# Patient Record
Sex: Male | Born: 1950 | Race: White | Hispanic: No | Marital: Single | Smoking: Former smoker
Health system: Southern US, Community
[De-identification: ages and names within clinical notes are randomized; demographics above are authoritative.]

## PROBLEM LIST (undated history)

## (undated) DIAGNOSIS — I429 Cardiomyopathy, unspecified: Secondary | ICD-10-CM

## (undated) DIAGNOSIS — J449 Chronic obstructive pulmonary disease, unspecified: Secondary | ICD-10-CM

## (undated) DIAGNOSIS — I499 Cardiac arrhythmia, unspecified: Secondary | ICD-10-CM

## (undated) DIAGNOSIS — I10 Essential (primary) hypertension: Secondary | ICD-10-CM

## (undated) DIAGNOSIS — I509 Heart failure, unspecified: Secondary | ICD-10-CM

## (undated) HISTORY — PX: HERNIA REPAIR: SHX51

## (undated) HISTORY — DX: Heart failure, unspecified: I50.9

## (undated) HISTORY — DX: Essential (primary) hypertension: I10

## (undated) HISTORY — DX: Cardiac arrhythmia, unspecified: I49.9

## (undated) HISTORY — DX: Cardiomyopathy, unspecified: I42.9

## (undated) HISTORY — DX: Chronic obstructive pulmonary disease, unspecified: J44.9

---

## 2005-12-15 ENCOUNTER — Emergency Department (HOSPITAL_COMMUNITY): Admission: EM | Admit: 2005-12-15 | Discharge: 2005-12-15 | Payer: Self-pay | Admitting: Emergency Medicine

## 2005-12-22 ENCOUNTER — Ambulatory Visit: Payer: Self-pay | Admitting: Internal Medicine

## 2005-12-23 ENCOUNTER — Ambulatory Visit: Payer: Self-pay | Admitting: *Deleted

## 2010-03-28 ENCOUNTER — Ambulatory Visit: Payer: Self-pay | Admitting: Adult Health

## 2011-05-23 ENCOUNTER — Ambulatory Visit: Payer: Self-pay | Admitting: "Endocrinology

## 2013-01-04 ENCOUNTER — Ambulatory Visit: Payer: Self-pay | Admitting: Internal Medicine

## 2013-02-02 DIAGNOSIS — J449 Chronic obstructive pulmonary disease, unspecified: Secondary | ICD-10-CM | POA: Insufficient documentation

## 2014-01-25 LAB — PSA: PSA: 0.7

## 2014-02-13 ENCOUNTER — Ambulatory Visit: Payer: Self-pay | Admitting: Internal Medicine

## 2014-09-06 ENCOUNTER — Ambulatory Visit: Payer: Self-pay | Admitting: Internal Medicine

## 2014-09-06 DIAGNOSIS — I429 Cardiomyopathy, unspecified: Secondary | ICD-10-CM | POA: Insufficient documentation

## 2014-09-06 DIAGNOSIS — I10 Essential (primary) hypertension: Secondary | ICD-10-CM | POA: Insufficient documentation

## 2014-09-06 LAB — HEPATIC FUNCTION PANEL
ALT: 18 U/L (ref 10–40)
AST: 14 U/L (ref 14–40)
Alkaline Phosphatase: 90 U/L (ref 25–125)
Bilirubin, Total: 0.7 mg/dL

## 2014-09-06 LAB — BASIC METABOLIC PANEL
BUN: 6 mg/dL (ref 4–21)
CREATININE: 0.9 mg/dL (ref 0.6–1.3)
Glucose: 409 mg/dL
POTASSIUM: 4.6 mmol/L (ref 3.4–5.3)
SODIUM: 137 mmol/L (ref 137–147)

## 2014-09-06 LAB — CBC AND DIFFERENTIAL
HCT: 43 % (ref 41–53)
HEMOGLOBIN: 14.9 g/dL (ref 13.5–17.5)
Neutrophils Absolute: 5 /uL
Platelets: 171 10*3/uL (ref 150–399)
WBC: 7.5 10^3/mL

## 2014-09-06 LAB — TSH: TSH: 2.66 u[IU]/mL (ref 0.41–5.90)

## 2014-09-06 LAB — LIPID PANEL
CHOLESTEROL: 165 mg/dL (ref 0–200)
HDL: 29 mg/dL — AB (ref 35–70)
LDL Cholesterol: 100 mg/dL
TRIGLYCERIDES: 179 mg/dL — AB (ref 40–160)

## 2014-10-04 ENCOUNTER — Other Ambulatory Visit: Payer: Self-pay

## 2014-10-04 LAB — HEMOGLOBIN A1C: Hemoglobin A1C: 13.2

## 2014-10-17 ENCOUNTER — Ambulatory Visit: Payer: Self-pay

## 2014-10-17 DIAGNOSIS — E119 Type 2 diabetes mellitus without complications: Secondary | ICD-10-CM | POA: Insufficient documentation

## 2014-11-21 ENCOUNTER — Ambulatory Visit: Payer: Self-pay

## 2014-12-18 ENCOUNTER — Encounter: Payer: Self-pay | Admitting: Pharmacist

## 2014-12-18 ENCOUNTER — Encounter (INDEPENDENT_AMBULATORY_CARE_PROVIDER_SITE_OTHER): Payer: Self-pay

## 2015-04-11 ENCOUNTER — Ambulatory Visit: Payer: Self-pay | Admitting: Internal Medicine

## 2015-07-23 DIAGNOSIS — I10 Essential (primary) hypertension: Secondary | ICD-10-CM

## 2015-07-23 DIAGNOSIS — E119 Type 2 diabetes mellitus without complications: Secondary | ICD-10-CM

## 2015-07-23 DIAGNOSIS — J449 Chronic obstructive pulmonary disease, unspecified: Secondary | ICD-10-CM

## 2015-07-23 DIAGNOSIS — I429 Cardiomyopathy, unspecified: Secondary | ICD-10-CM

## 2016-05-08 HISTORY — PX: WRIST FRACTURE SURGERY: SHX121

## 2016-07-10 ENCOUNTER — Emergency Department
Admission: EM | Admit: 2016-07-10 | Discharge: 2016-07-10 | Disposition: A | Discharge status: Home / Self Care | Payer: Medicare Other | Attending: Emergency Medicine | Admitting: Emergency Medicine

## 2016-07-10 ENCOUNTER — Encounter: Payer: Self-pay | Admitting: *Deleted

## 2016-07-10 ENCOUNTER — Emergency Department: Payer: Medicare Other

## 2016-07-10 DIAGNOSIS — L03119 Cellulitis of unspecified part of limb: Secondary | ICD-10-CM

## 2016-07-10 DIAGNOSIS — L03113 Cellulitis of right upper limb: Secondary | ICD-10-CM | POA: Insufficient documentation

## 2016-07-10 DIAGNOSIS — J449 Chronic obstructive pulmonary disease, unspecified: Secondary | ICD-10-CM | POA: Diagnosis not present

## 2016-07-10 DIAGNOSIS — L02511 Cutaneous abscess of right hand: Secondary | ICD-10-CM | POA: Insufficient documentation

## 2016-07-10 DIAGNOSIS — M79641 Pain in right hand: Secondary | ICD-10-CM | POA: Diagnosis present

## 2016-07-10 DIAGNOSIS — I10 Essential (primary) hypertension: Secondary | ICD-10-CM | POA: Diagnosis not present

## 2016-07-10 DIAGNOSIS — Z87891 Personal history of nicotine dependence: Secondary | ICD-10-CM | POA: Diagnosis not present

## 2016-07-10 DIAGNOSIS — L02519 Cutaneous abscess of unspecified hand: Secondary | ICD-10-CM

## 2016-07-10 LAB — CBC WITH DIFFERENTIAL/PLATELET
BASOS ABS: 0 10*3/uL (ref 0–0.1)
BASOS PCT: 1 %
EOS ABS: 0.1 10*3/uL (ref 0–0.7)
Eosinophils Relative: 1 %
HCT: 41.9 % (ref 40.0–52.0)
HEMOGLOBIN: 14 g/dL (ref 13.0–18.0)
Lymphocytes Relative: 32 %
Lymphs Abs: 2.2 10*3/uL (ref 1.0–3.6)
MCH: 29.3 pg (ref 26.0–34.0)
MCHC: 33.5 g/dL (ref 32.0–36.0)
MCV: 87.6 fL (ref 80.0–100.0)
Monocytes Absolute: 0.6 10*3/uL (ref 0.2–1.0)
Monocytes Relative: 8 %
NEUTROS PCT: 58 %
Neutro Abs: 4.1 10*3/uL (ref 1.4–6.5)
PLATELETS: 166 10*3/uL (ref 150–440)
RBC: 4.79 MIL/uL (ref 4.40–5.90)
RDW: 15.9 % — AB (ref 11.5–14.5)
WBC: 7 10*3/uL (ref 3.8–10.6)

## 2016-07-10 LAB — BASIC METABOLIC PANEL
Anion gap: 7 (ref 5–15)
BUN: 14 mg/dL (ref 6–20)
CALCIUM: 9.4 mg/dL (ref 8.9–10.3)
CO2: 26 mmol/L (ref 22–32)
CREATININE: 0.71 mg/dL (ref 0.61–1.24)
Chloride: 107 mmol/L (ref 101–111)
GFR calc non Af Amer: >60 mL/min (ref >60)
Glucose, Bld: 103 mg/dL — ABNORMAL HIGH (ref 65–99)
Potassium: 4.2 mmol/L (ref 3.5–5.1)
Sodium: 140 mmol/L (ref 135–145)

## 2016-07-10 MED ORDER — SILVER SULFADIAZINE 1 % EX CREA: TOPICAL_CREAM | CUTANEOUS | 1 refills | Status: DC

## 2016-07-10 MED ORDER — CLINDAMYCIN HCL 300 MG PO CAPS: 300.0000 mg | ORAL_CAPSULE | Freq: Three times a day (TID) | ORAL | 0 refills | Status: DC

## 2016-07-10 MED ORDER — VANCOMYCIN HCL IN DEXTROSE 1-5 GM/200ML-% IV SOLN
1000.0000 mg | Freq: Once | INTRAVENOUS | Status: AC
  Administered 2016-07-10: 1000 mg via INTRAVENOUS
  Filled 2016-07-10: qty 200

## 2016-07-10 NOTE — ED Provider Notes (Signed)
[facility] Emergency Department Provider Note       Time seen: ----------------------------------------- 6:24 PM on 07/10/2016 -----------------------------------------     I have reviewed the triage vital signs and the nursing notes.   HISTORY   Chief Complaint Hand Pain    HPI Leon Montoya is a 66 y.o. male who presents to the ED for right hand swelling. Patient states that February he slipped and fell down a wet bank and injury to his right hand. He subsequently underwent extensive wound care and debridement at the [facility]. Patient was seen at [facility] clinic today for dressing change and was told to come here for swelling. Patient states it's more swollen than it has been but is not causing any pain. He denies fevers, chills or other complaints.   Past Medical History:  Diagnosis Date  . Cardiomyopathy (HCC)   . COPD (chronic obstructive pulmonary disease) (HCC)   . Hypertension     Patient Active Problem List   Diagnosis Date Noted  . Diabetes (HCC) 10/17/2014  . Hypertension 09/06/2014  . Cardiomyopathy (HCC) 09/06/2014  . COPD (chronic obstructive pulmonary disease) (HCC) 02/02/2013    Past Surgical History:  Procedure Laterality Date  . HERNIA REPAIR     x 3    Allergies Penicillins  Social History Social History  Substance Use Topics  . Smoking status: Former Smoker    Types: Cigarettes    Quit date: 10/05/2004  . Smokeless tobacco: Not on file  . Alcohol use No    Review of Systems Constitutional: Negative for fever. Cardiovascular: Negative for chest pain. Respiratory: Negative for shortness of breath. Gastrointestinal: Negative for abdominal pain, vomiting and diarrhea. Musculoskeletal: Positive for right hand swelling Skin: Positive for right hand wounds Neurological: Negative for headaches, focal weakness or numbness.  10-point ROS otherwise  negative.  ____________________________________________   PHYSICAL EXAM:  VITAL SIGNS: ED Triage Vitals  Enc Vitals Group     BP 07/10/16 1632 (!) 149/83     Pulse Rate 07/10/16 1632 74     Resp 07/10/16 1632 18     Temp 07/10/16 1632 98.3 F (36.8 C)     Temp Source 07/10/16 1632 Oral     SpO2 07/10/16 1632 99 %     Weight 07/10/16 1633 196 lb (88.9 kg)     Height 07/10/16 1633  (1.778 m)     Head Circumference --      Peak Flow --      Pain Score --      Pain Loc --      Pain Edu? --      Excl. in GC? --     Constitutional: Alert and oriented. Well appearing and in no distress. Eyes: Conjunctivae are normal. PERRL. Normal extraocular movements. Cardiovascular: Normal rate, regular rhythm. No murmurs, rubs, or gallops. Respiratory: Normal respiratory effort without tachypnea nor retractions. Breath sounds are clear and equal bilaterally. No wheezes/rales/rhonchi. Gastrointestinal: Soft and nontender. Normal bowel sounds Musculoskeletal: Marked right hand swelling that is diffuse but nontender. He has limited range of motion of his fingers. There are multiple wounds on the dorsal and ventral aspect of his right hand that appeared to be subacute or chronic. There is no active cellulitis visible or purulent drainage. Neurologic:  Normal speech and language. No gross focal neurologic deficits are appreciated.  Skin:  Right hand wounds and swelling as noted above Psychiatric: Mood and affect are normal. Speech and behavior are normal.  ____________________________________________  ED COURSE:  Pertinent labs & imaging results that were available during my care of the patient were reviewed by me and considered in my medical decision making (see chart for details). Patient presents for right hand swelling, we will assess with labs and imaging as indicated.   Procedures ____________________________________________   LABS (pertinent positives/negatives)  Labs Reviewed   CBC WITH DIFFERENTIAL/PLATELET - Abnormal; Notable for the following:       Result Value   RDW 15.9 (*)    All other components within normal limits  BASIC METABOLIC PANEL - Abnormal; Notable for the following:    Glucose, Bld 103 (*)    All other components within normal limits    RADIOLOGY Images were viewed by me  Right hand x-rays IMPRESSION: Comminuted distal radius fracture with associated ulnar styloid fracture.  Distally amputation second and third fingers.  Diffuse osteopenia.  Diffuse soft tissue edema. Soft tissue infection cannot be excluded by x-ray. ____________________________________________  FINAL ASSESSMENT AND PLAN  Right hand swelling, history of complex distal forearm fracture and wounds, cellulitis  Plan: Patient's labs and imaging were dictated above. Patient had presented for right hand swelling. Patient was not concerned about any pain and states it somewhat more swollen than normal. He was given IV vancomycin and will be discharged with antibiotics and close outpatient wound follow-up. I will refer him to the wound healing Center.   Emily Filbert, MD   Note: This note was generated in part or whole with voice recognition software. Voice recognition is usually quite accurate but there are transcription errors that can and very often do occur. I apologize for any typographical errors that were not detected and corrected.     Emily Filbert, MD 07/10/16 1901

## 2016-07-10 NOTE — ED Triage Notes (Signed)
States in february he slipped and fell down a wet bank and had an injury to his right hand, states in February sometime he had his right hand debrided [facility], states he went to [facility] clinic today for a dressing change on his right hand and was sent to ED for right hand swelling, arrives with right hand wrapped in gauze, hand appears more swollen then left, able to move fingers, color appropriate

## 2016-07-14 ENCOUNTER — Encounter: Payer: Medicare Other | Attending: Surgery | Admitting: Surgery

## 2016-07-14 DIAGNOSIS — E11622 Type 2 diabetes mellitus with other skin ulcer: Secondary | ICD-10-CM | POA: Insufficient documentation

## 2016-07-14 DIAGNOSIS — E1140 Type 2 diabetes mellitus with diabetic neuropathy, unspecified: Secondary | ICD-10-CM | POA: Diagnosis not present

## 2016-07-14 DIAGNOSIS — Z87891 Personal history of nicotine dependence: Secondary | ICD-10-CM | POA: Diagnosis not present

## 2016-07-14 DIAGNOSIS — I10 Essential (primary) hypertension: Secondary | ICD-10-CM | POA: Diagnosis not present

## 2016-07-14 DIAGNOSIS — J449 Chronic obstructive pulmonary disease, unspecified: Secondary | ICD-10-CM | POA: Insufficient documentation

## 2016-07-14 DIAGNOSIS — Z79899 Other long term (current) drug therapy: Secondary | ICD-10-CM | POA: Diagnosis not present

## 2016-07-14 DIAGNOSIS — I429 Cardiomyopathy, unspecified: Secondary | ICD-10-CM | POA: Diagnosis not present

## 2016-07-14 DIAGNOSIS — S61401A Unspecified open wound of right hand, initial encounter: Secondary | ICD-10-CM | POA: Insufficient documentation

## 2016-07-14 DIAGNOSIS — Z880 Allergy status to penicillin: Secondary | ICD-10-CM | POA: Diagnosis not present

## 2016-07-14 DIAGNOSIS — T8781 Dehiscence of amputation stump: Secondary | ICD-10-CM | POA: Insufficient documentation

## 2016-07-14 DIAGNOSIS — Y839 Surgical procedure, unspecified as the cause of abnormal reaction of the patient, or of later complication, without mention of misadventure at the time of the procedure: Secondary | ICD-10-CM | POA: Insufficient documentation

## 2016-07-14 NOTE — Progress Notes (Signed)
Danny, [name] ([id number]) Visit Report for 07/14/2016 Abuse/Suicide Risk Screen Details Patient Name: Danny [name], Danny [name]. Date of Service: 07/14/2016 8:00 AM Medical Record Number: [id number] Patient Account Number: [id number] Date of Birth/Sex: [birth year] (66 y.o. Male) Treating RN: Curtis Sites Primary Care Yasha Tibbett: Lanier Ensign Other Clinician: Referring Curtiss Mahmood: Daryel November Treating Andrez Lieurance/Extender: Rudene Re in Treatment: 0 Abuse/Suicide Risk Screen Items Answer ABUSE/SUICIDE RISK SCREEN: Has anyone close to you tried to hurt or harm you recentlyo No Do you feel uncomfortable with anyone in your familyo No Has anyone forced you do things that you didnot want to doo No Do you have any thoughts of harming yourselfo No Patient displays signs or symptoms of abuse and/or neglect. No Electronic Signature(s) Signed: 07/14/2016 9:41:00 AM By: Curtis Sites Entered By: Curtis Sites on 07/14/2016 08:20:57 Fissel, Danny [name] ([id number]) -------------------------------------------------------------------------------- Activities of Daily Living Details Patient Name: [name], [name] Date of Service: 07/14/2016 8:00 AM Medical Record Number: [id number] Patient Account Number: [id number] Date of Birth/Sex: [birth year] (66 y.o. Male) Treating RN: Curtis Sites Primary Care Allice Garro: Lanier Ensign Other Clinician: Referring Luise Yamamoto: Daryel November Treating Rahma Meller/Extender: Rudene Re in Treatment: 0 Activities of Daily Living Items Answer Activities of Daily Living (Please select one for each item) Drive Automobile Not Able Take Medications Completely Able Use Telephone Completely Able Care for Appearance Completely Able Use Toilet Completely Able Bath / Shower Completely Able Dress Self Completely Able Feed Self Completely Able Walk Completely Able Get In / Out Bed Completely Able Housework Completely Able Prepare Meals Completely  Able Handle Money Completely Able Shop for Self Completely Able Electronic Signature(s) Signed: 07/14/2016 9:41:00 AM By: Curtis Sites Entered By: Curtis Sites on 07/14/2016 08:21:28 Vogel, Danny [name] ([id number]) -------------------------------------------------------------------------------- Education Assessment Details Patient Name: [name], [name] Date of Service: 07/14/2016 8:00 AM Medical Record Number: [id number] Patient Account Number: [id number] Date of Birth/Sex: 1951-01-11 (66 y.o. Male) Treating RN: Curtis Sites Primary Care Octavie Westerhold: Lanier Ensign Other Clinician: Referring Aronda Burford: Daryel November Treating Aluel Schwarz/Extender: Rudene Re in Treatment: 0 Primary Learner Assessed: Caregiver HHRN Reason Patient is not Primary Learner: wound location Learning Preferences/Education Level/Primary Language Learning Preference: Explanation, Demonstration Highest Education Level: High School Preferred Language: English Cognitive Barrier Assessment/Beliefs Language Barrier: No Translator Needed: No Memory Deficit: No Emotional Barrier: No Cultural/Religious Beliefs Affecting Medical No Care: Physical Barrier Assessment Impaired Vision: No Impaired Hearing: No Decreased Hand dexterity: No Knowledge/Comprehension Assessment Knowledge Level: Medium Comprehension Level: Medium Ability to understand written Medium instructions: Ability to understand verbal Medium instructions: Motivation Assessment Anxiety Level: Calm Cooperation: Cooperative Education Importance: Acknowledges Need Interest in Health Problems: Asks Questions Perception: Coherent Willingness to Engage in Self- Medium Management Activities: Readiness to Engage in Self- Medium Management Activities: VERA, Danny [name] ([id number]) Electronic Signature(s) Signed: 07/14/2016 9:41:00 AM By: Curtis Sites Entered By: Curtis Sites on 07/14/2016 08:22:06 Piggott, Danny [name]  ([id number]) -------------------------------------------------------------------------------- Fall Risk Assessment Details Patient Name: [name], [name] Date of Service: 07/14/2016 8:00 AM Medical Record Number: [id number] Patient Account Number: [id number] Date of Birth/Sex: July 05, 1950 (66 y.o. Male) Treating RN: Curtis Sites Primary Care Tephanie Escorcia: Lanier Ensign Other Clinician: Referring Iya Hamed: Daryel November Treating Glyndon Tursi/Extender: Rudene Re in Treatment: 0 Fall Risk Assessment Items Have you had 2 or more falls in the last 12 monthso 0 No Have you had any fall that resulted in injury in the last 12 monthso 0 Yes FALL RISK ASSESSMENT: History of falling - immediate or within 3 months 25 Yes Secondary diagnosis 0 No Ambulatory aid  None/bed rest/wheelchair/nurse 0 No Crutches/cane/walker 0 No Furniture 0 No IV Access/Saline Lock 0 No Gait/Training Normal/bed rest/immobile 0 No Weak 10 Yes Impaired 0 No Mental Status Oriented to own ability 0 Yes Electronic Signature(s) Signed: 07/14/2016 9:41:00 AM By: Curtis Sites Entered By: Curtis Sites on 07/14/2016 08:22:29 Capitano, Danny [name] ([id number]) -------------------------------------------------------------------------------- Nutrition Risk Assessment Details Patient Name: [name], [name] Date of Service: 07/14/2016 8:00 AM Medical Record Number: [id number] Patient Account Number: [id number] Date of Birth/Sex: 12/23/1950 (66 y.o. Male) Treating RN: Curtis Sites Primary Care Shiah Berhow: Lanier Ensign Other Clinician: Referring Zellie Jenning: Daryel November Treating Keefe Zawistowski/Extender: Rudene Re in Treatment: 0 Height (in): 71 Weight (lbs): 183 Body Mass Index (BMI): 25.5 Nutrition Risk Assessment Items NUTRITION RISK SCREEN: I have an illness or condition that made me change the kind and/or 0 No amount of food I eat I eat fewer than two meals per day 0 No I eat few fruits and  vegetables, or milk products 0 No I have three or more drinks of beer, liquor or wine almost every day 0 No I have tooth or mouth problems that make it hard for me to eat 0 No I don't always have enough money to buy the food I need 0 No I eat alone most of the time 0 No I take three or more different prescribed or over-the-counter drugs a 1 Yes day Without wanting to, I have lost or gained 10 pounds in the last six 0 No months I am not always physically able to shop, cook and/or feed myself 0 No Nutrition Protocols Good Risk Protocol 0 No interventions needed Moderate Risk Protocol Electronic Signature(s) Signed: 07/14/2016 9:41:00 AM By: Curtis Sites Entered By: Curtis Sites on 07/14/2016 08:22:34

## 2016-07-14 NOTE — Progress Notes (Addendum)
Danny, [name] ([id number]) Visit Report for 07/14/2016 Allergy List Details Patient Name: Danny [name], Danny [name]. Date of Service: 07/14/2016 8:00 AM Medical Record Number: [id number] Patient Account Number: [id number] Date of Birth/Sex: 14-Nov-1950 (66 y.o. Male) Treating RN: Curtis Sites Primary Care Lemuel Boodram: Lanier Ensign Other Clinician: Referring Wataru Mccowen: Daryel November Treating Eino Whitner/Extender: Rudene Re in Treatment: 0 Allergies Active Allergies penicillin Allergy Notes Electronic Signature(s) Signed: 07/14/2016 9:41:00 AM By: Curtis Sites Entered By: Curtis Sites on 07/14/2016 08:20:49 Aspinall, Ivan Anchors ([id number]) -------------------------------------------------------------------------------- Arrival Information Details Patient Name: Danny, Zylen L. Date of Service: 07/14/2016 8:00 AM Medical Record Number: [id number] Patient Account Number: [id number] Date of Birth/Sex: 05-02-1950 (66 y.o. Male) Treating RN: Curtis Sites Primary Care Milyn Stapleton: Lanier Ensign Other Clinician: Referring Tita Terhaar: Daryel November Treating Tamon Parkerson/Extender: Rudene Re in Treatment: 0 Visit Information Patient Arrived: Ambulatory Arrival Time: 08:16 Accompanied By: girlfriend Transfer Assistance: None Patient Identification Verified: Yes Secondary Verification Process Yes Completed: Patient Has Alerts: Yes Patient Alerts: DMII Electronic Signature(s) Signed: 07/14/2016 9:41:00 AM By: Curtis Sites Entered By: Curtis Sites on 07/14/2016 08:17:32 Junio, Ivan Anchors ([id number]) -------------------------------------------------------------------------------- Encounter Discharge Information Details Patient Name: Danny Field, Olajuwon L. Date of Service: 07/14/2016 8:00 AM Medical Record Number: [id number] Patient Account Number: [id number] Date of Birth/Sex: [birth year] (66 y.o. Male) Treating RN: Curtis Sites Primary Care Ruthvik Barnaby: Lanier Ensign Other  Clinician: Referring Julie Paolini: Daryel November Treating Christyann Manolis/Extender: Rudene Re in Treatment: 0 Encounter Discharge Information Items Discharge Pain Level: 0 Discharge Condition: Stable Ambulatory Status: Ambulatory Discharge Destination: Home Transportation: Private Auto Accompanied By: girlfriend Schedule Follow-up Appointment: Yes Medication Reconciliation completed and provided to Patient/Care No Tarez Bowns: Provided on Clinical Summary of Care: 07/14/2016 Form Type Recipient Paper Patient RW Electronic Signature(s) Signed: 07/14/2016 9:23:00 AM By: Gwenlyn Perking Entered By: Gwenlyn Perking on 07/14/2016 09:23:00 Brett, Giavonni L. ([id number]) -------------------------------------------------------------------------------- Multi Wound Chart Details Patient Name: Danny, Jaece L. Date of Service: 07/14/2016 8:00 AM Medical Record Number: [id number] Patient Account Number: [id number] Date of Birth/Sex: [birth year] (66 y.o. Male) Treating RN: Curtis Sites Primary Care Sedalia Greeson: Lanier Ensign Other Clinician: Referring Mcdonald Reiling: Daryel November Treating Vondra Aldredge/Extender: Rudene Re in Treatment: 0 Vital Signs Height(in): 71 Pulse(bpm): 78 Weight(lbs): 183 Blood Pressure 137/91 (mmHg): Body Mass Index(BMI): 26 Temperature(F): 98.2 Respiratory Rate 18 (breaths/min): Photos: [1:No Photos] [2:No Photos] [3:No Photos] Wound Location: [1:Right Hand - Palm - Lateral] [2:Right Hand - Palm - Lateral] [3:Right Hand - 2nd Digit] Wounding Event: [1:Trauma] [2:Trauma] [3:Trauma] Primary Etiology: [1:Trauma, Other] [2:Trauma, Other] [3:Open Surgical Wound] Comorbid History: [1:Chronic Obstructive Pulmonary Disease (COPD), Hypertension, Type II Diabetes, Neuropathy] [2:Chronic Obstructive Pulmonary Disease (COPD), Hypertension, Type II Diabetes, Neuropathy] [3:Chronic Obstructive Pulmonary Disease (COPD),  Hypertension, Type II Diabetes, Neuropathy] Date  Acquired: [1:05/12/2016] [2:05/12/2016] [3:05/26/2016] Weeks of Treatment: [1:0] [2:0] [3:0] Wound Status: [1:Open] [2:Open] [3:Open] Pending Amputation on Yes [2:Yes] [3:Yes] Presentation: Measurements L x W x D 1.4x0.3x0.1 [2:2.4x2.1x1] [3:2x1.5x0.1] (cm) Area (cm) : [1:0.33] [2:3.958] [3:2.356] Volume (cm) : [1:0.033] [2:3.958] [3:0.236] Classification: [1:Partial Thickness] [2:Full Thickness With Exposed Support Structures] [3:Full Thickness Without Exposed Support Structures] Exudate Amount: [1:Medium] [2:Large] [3:None Present] Exudate Type: [1:Serous] [2:Serous] [3:N/A] Exudate Color: [1:amber] [2:amber] [3:N/A] Wound Margin: [1:Flat and Intact] [2:Flat and Intact] [3:Flat and Intact] Granulation Amount: [1:Small (1-33%)] [2:None Present (0%)] [3:None Present (0%)] Granulation Quality: [1:Pink] [2:N/A] [3:N/A] Necrotic Amount: [1:Large (67-100%)] [2:Large (67-100%)] [3:Large (67-100%)] Necrotic Tissue: [1:Eschar, Adherent Slough Eschar, Adherent Slough] [3:Eschar] Exposed Structures: Fascia: No Fascia: No Fascia: No Fat Layer (Subcutaneous Fat Layer (Subcutaneous Fat Layer (Subcutaneous Tissue)  Exposed: No Tissue) Exposed: No Tissue) Exposed: No Tendon: No Tendon: No Tendon: No Muscle: No Muscle: No Muscle: No Joint: No Joint: No Joint: No Bone: No Bone: No Bone: No Limited to Skin Limited to Skin Limited to Skin Breakdown Breakdown Breakdown Epithelialization: Small (1-33%) None None Debridement: N/A Open Wound/Selective N/A ([id number]) - Selective Pre-procedure N/A 08:58 N/A Verification/Time Out Taken: Pain Control: N/A Lidocaine 4% Topical N/A Solution Tissue Debrided: N/A Necrotic/Eschar, N/A Fibrin/Slough Level: N/A Non-Viable Tissue N/A Debridement Area (sq N/A 5.04 N/A cm): Instrument: N/A Forceps, Scissors N/A Bleeding: N/A None N/A Procedural Pain: N/A 0 N/A Post Procedural Pain: N/A 0 N/A Debridement Treatment N/A Procedure was tolerated  N/A Response: well Post Debridement N/A 2.4x2.1x1 N/A Measurements L x W x D (cm) Post Debridement N/A 3.958 N/A Volume: (cm) Periwound Skin Texture: Excoriation: No Excoriation: No Excoriation: No Induration: No Induration: No Induration: No Callus: No Callus: No Callus: No Crepitus: No Crepitus: No Crepitus: No Rash: No Rash: No Rash: No Scarring: No Scarring: No Scarring: No Periwound Skin Maceration: No Maceration: No Maceration: No Moisture: Dry/Scaly: No Dry/Scaly: No Dry/Scaly: No Periwound Skin Color: Erythema: Yes Erythema: Yes Atrophie Blanche: No Atrophie Blanche: No Atrophie Blanche: No Cyanosis: No Cyanosis: No Cyanosis: No Ecchymosis: No Ecchymosis: No Ecchymosis: No Erythema: No Hemosiderin Staining: No Hemosiderin Staining: No Hemosiderin Staining: No Mottled: No Mottled: No Mottled: No Pallor: No Pallor: No Pallor: No Rubor: No Rubor: No Rubor: No Erythema Location: Circumferential Circumferential N/A Ciampa, Sachit L. ([id number]) Temperature: No Abnormality No Abnormality No Abnormality Tenderness on Yes Yes No Palpation: Wound Preparation: Ulcer Cleansing: Ulcer Cleansing: Ulcer Cleansing: Rinsed/Irrigated with Rinsed/Irrigated with Rinsed/Irrigated with Saline Saline Saline Topical Anesthetic Topical Anesthetic Topical Anesthetic Applied: Other: lidocaine Applied: Other: lidocaine Applied: Other: lidocaine 4% 4% 4% Procedures Performed: N/A Debridement N/A Wound Number: 4 N/A N/A Photos: No Photos N/A N/A Wound Location: Right Hand - 3rd Digit N/A N/A Wounding Event: Surgical Injury N/A N/A Primary Etiology: Open Surgical Wound N/A N/A Comorbid History: Chronic Obstructive N/A N/A Pulmonary Disease (COPD), Hypertension, Type II Diabetes, Neuropathy Date Acquired: 05/26/2016 N/A N/A Weeks of Treatment: 0 N/A N/A Wound Status: Open N/A N/A Pending Amputation on Yes N/A N/A Presentation: Measurements L x W x D  2x0.6x0.1 N/A N/A (cm) Area (cm) : 0.942 N/A N/A Volume (cm) : 0.094 N/A N/A Classification: Full Thickness Without N/A N/A Exposed Support Structures Exudate Amount: None Present N/A N/A Exudate Type: N/A N/A N/A Exudate Color: N/A N/A N/A Wound Margin: Flat and Intact N/A N/A Granulation Amount: None Present (0%) N/A N/A Granulation Quality: N/A N/A N/A Necrotic Amount: Large (67-100%) N/A N/A Necrotic Tissue: Eschar, Adherent Slough N/A N/A Exposed Structures: Fascia: No N/A N/A Fat Layer (Subcutaneous Tissue) Exposed: No Tendon: No Muscle: No Joint: No Bone: No Peer, Reagen L. ([id number]) Limited to Skin Breakdown Epithelialization: None N/A N/A Debridement: N/A N/A N/A Pain Control: N/A N/A N/A Tissue Debrided: N/A N/A N/A Level: N/A N/A N/A Debridement Area (sq N/A N/A N/A cm): Instrument: N/A N/A N/A Bleeding: N/A N/A N/A Procedural Pain: N/A N/A N/A Post Procedural Pain: N/A N/A N/A Debridement Treatment N/A N/A N/A Response: Post Debridement N/A N/A N/A Measurements L x W x D (cm) Post Debridement N/A N/A N/A Volume: (cm) Periwound Skin Texture: Excoriation: No N/A N/A Induration: No Callus: No Crepitus: No Rash: No Scarring: No Periwound Skin Maceration: No N/A N/A Moisture: Dry/Scaly: No Periwound Skin Color: Atrophie Blanche: No N/A N/A Cyanosis: No Ecchymosis: No  Erythema: No Hemosiderin Staining: No Mottled: No Pallor: No Rubor: No Erythema Location: N/A N/A N/A Temperature: No Abnormality N/A N/A Tenderness on No N/A N/A Palpation: Wound Preparation: Ulcer Cleansing: N/A N/A Rinsed/Irrigated with Saline Topical Anesthetic Applied: Other: lidcoaine 4% Procedures Performed: N/A N/A N/A Treatment Notes DNAIEL, VOLLER ([id number]) Electronic Signature(s) Signed: 07/14/2016 9:41:39 AM By: Evlyn Kanner MD, FACS Previous Signature: 07/14/2016 9:41:00 AM Version By: Curtis Sites Entered By: Evlyn Kanner on 07/14/2016  09:41:39 Wren, Ivan Anchors ([id number]) -------------------------------------------------------------------------------- Multi-Disciplinary Care Plan Details Patient Name: Bebout, Loys L. Date of Service: 07/14/2016 8:00 AM Medical Record Number: [id number] Patient Account Number: [id number] Date of Birth/Sex: 1951-03-14 (66 y.o. Male) Treating RN: Curtis Sites Primary Care Trayden Brandy: Lanier Ensign Other Clinician: Referring Mckoy Bhakta: Daryel November Treating Sekou Zuckerman/Extender: Rudene Re in Treatment: 0 Active Inactive Electronic Signature(s) Signed: 07/14/2016 9:41:00 AM By: Curtis Sites Entered By: Curtis Sites on 07/14/2016 09:04:13 Gotcher, Nyair Elbert Ewings ([id number]) -------------------------------------------------------------------------------- Pain Assessment Details Patient Name: Bettes, Ann L. Date of Service: 07/14/2016 8:00 AM Medical Record Number: [id number] Patient Account Number: [id number] Date of Birth/Sex: [birth year] (66 y.o. Male) Treating RN: Curtis Sites Primary Care Darlina Mccaughey: Lanier Ensign Other Clinician: Referring Jacquita Mulhearn: Daryel November Treating Shahla Betsill/Extender: Rudene Re in Treatment: 0 Active Problems Location of Pain Severity and Description of Pain Patient Has Paino No Site Locations Pain Management and Medication Current Pain Management: Notes Topical or injectable lidocaine is offered to patient for acute pain when surgical debridement is performed. If needed, Patient is instructed to use over the counter pain medication for the following 24-48 hours after debridement. Wound care MDs do not prescribed pain medications. Patient has chronic pain or uncontrolled pain. Patient has been instructed to make an appointment with their Primary Care Physician for pain management. Electronic Signature(s) Signed: 07/14/2016 9:41:00 AM By: Curtis Sites Entered By: Curtis Sites on 07/14/2016 08:17:45 Karan, Ivan Anchors  ([id number]) -------------------------------------------------------------------------------- Patient/Caregiver Education Details Patient Name: Wynonia Lawman. Date of Service: 07/14/2016 8:00 AM Medical Record Number: [id number] Patient Account Number: [id number] Date of Birth/Gender: March 24, 1951 (66 y.o. Male) Treating RN: Curtis Sites Primary Care Physician: Lanier Ensign Other Clinician: Referring Physician: Daryel November Treating Physician/Extender: Rudene Re in Treatment: 0 Education Assessment Education Provided To: Patient and Caregiver Education Topics Provided Wound/Skin Impairment: Handouts: Other: wound care as ordered Methods: Demonstration, Explain/Verbal Responses: State content correctly Electronic Signature(s) Signed: 07/14/2016 9:41:00 AM By: Curtis Sites Entered By: Curtis Sites on 07/14/2016 08:55:45 Shular, Tavish Elbert Ewings ([id number]) -------------------------------------------------------------------------------- Wound Assessment Details Patient Name: Bohan, Xzaiver L. Date of Service: 07/14/2016 8:00 AM Medical Record Number: [id number] Patient Account Number: [id number] Date of Birth/Sex: 06/23/1950 (66 y.o. Male) Treating RN: Curtis Sites Primary Care Ethan Clayburn: Lanier Ensign Other Clinician: Referring Leata Dominy: Daryel November Treating Phaedra Colgate/Extender: Rudene Re in Treatment: 0 Wound Status Wound Number: 1 Primary Trauma, Other Etiology: Wound Location: Right Hand - Palm - Lateral Wound Open Wounding Event: Trauma Status: Date Acquired: 05/12/2016 Comorbid Chronic Obstructive Pulmonary Weeks Of Treatment: 0 History: Disease (COPD), Hypertension, Type II Clustered Wound: No Diabetes, Neuropathy Pending Amputation On Presentation Wound Measurements Length: (cm) 1.4 Width: (cm) 0.3 Depth: (cm) 0.1 Area: (cm) 0.33 Volume: (cm) 0.033 % Reduction in Area: % Reduction in Volume: Epithelialization: Small  (1-33%) Tunneling: No Undermining: No Wound Description Classification: Partial Thickness Wound Margin: Flat and Intact Exudate Amount: Medium Exudate Type: Serous Exudate Color: amber Foul Odor After Cleansing: No Slough/Fibrino Yes Wound Bed Granulation Amount: Small (1-33%) Exposed Structure Granulation Quality: Pink Fascia Exposed: No Necrotic  Amount: Large (67-100%) Fat Layer (Subcutaneous Tissue) Exposed: No Necrotic Quality: Eschar, Adherent Slough Tendon Exposed: No Muscle Exposed: No Joint Exposed: No Bone Exposed: No Limited to Skin Breakdown Periwound Skin Texture Texture Color No Abnormalities Noted: No No Abnormalities Noted: No Callus: No Atrophie Blanche: No Crepitus: No Cyanosis: No Excoriation: No Ecchymosis: No Walstad, Tait L. ([id number]) Induration: No Erythema: Yes Rash: No Erythema Location: Circumferential Scarring: No Hemosiderin Staining: No Mottled: No Moisture Pallor: No No Abnormalities Noted: No Rubor: No Dry / Scaly: No Maceration: No Temperature / Pain Temperature: No Abnormality Tenderness on Palpation: Yes Wound Preparation Ulcer Cleansing: Rinsed/Irrigated with Saline Topical Anesthetic Applied: Other: lidocaine 4%, Electronic Signature(s) Signed: 07/14/2016 9:41:00 AM By: Curtis Sites Entered By: Curtis Sites on 07/14/2016 08:40:23 Callan, Ritchard Elbert Ewings ([id number]) -------------------------------------------------------------------------------- Wound Assessment Details Patient Name: Buchberger, Vrishank L. Date of Service: 07/14/2016 8:00 AM Medical Record Number: [id number] Patient Account Number: [id number] Date of Birth/Sex: [birth year] (66 y.o. Male) Treating RN: Curtis Sites Primary Care Pricsilla Lindvall: Lanier Ensign Other Clinician: Referring Britney Captain: Daryel November Treating Della Homan/Extender: Rudene Re in Treatment: 0 Wound Status Wound Number: 2 Primary Trauma, Other Etiology: Wound Location: Right  Hand - Palm - Lateral Wound Open Wounding Event: Trauma Status: Date Acquired: 05/12/2016 Comorbid Chronic Obstructive Pulmonary Weeks Of Treatment: 0 History: Disease (COPD), Hypertension, Type II Clustered Wound: No Diabetes, Neuropathy Pending Amputation On Presentation Wound Measurements Length: (cm) 2.4 Width: (cm) 2.1 Depth: (cm) 1 Area: (cm) 3.958 Volume: (cm) 3.958 % Reduction in Area: % Reduction in Volume: Epithelialization: None Tunneling: No Undermining: No Wound Description Full Thickness With Exposed Classification: Support Structures Wound Margin: Flat and Intact Exudate Large Amount: Exudate Type: Serous Exudate Color: amber Foul Odor After Cleansing: No Slough/Fibrino Yes Wound Bed Granulation Amount: None Present (0%) Exposed Structure Necrotic Amount: Large (67-100%) Fascia Exposed: No Necrotic Quality: Eschar, Adherent Slough Fat Layer (Subcutaneous Tissue) Exposed: No Tendon Exposed: No Muscle Exposed: No Joint Exposed: No Bone Exposed: No Limited to Skin Breakdown Periwound Skin Texture Texture Color No Abnormalities Noted: No No Abnormalities Noted: No Callus: No Atrophie Blanche: No Wheeler, Nahsir L. ([id number]) Crepitus: No Cyanosis: No Excoriation: No Ecchymosis: No Induration: No Erythema: Yes Rash: No Erythema Location: Circumferential Scarring: No Hemosiderin Staining: No Mottled: No Moisture Pallor: No No Abnormalities Noted: No Rubor: No Dry / Scaly: No Maceration: No Temperature / Pain Temperature: No Abnormality Tenderness on Palpation: Yes Wound Preparation Ulcer Cleansing: Rinsed/Irrigated with Saline Topical Anesthetic Applied: Other: lidocaine 4%, Electronic Signature(s) Signed: 07/14/2016 9:41:00 AM By: Curtis Sites Entered By: Curtis Sites on 07/14/2016 08:41:32 Alleyne, Alonso Elbert Ewings ([id number]) -------------------------------------------------------------------------------- Wound Assessment  Details Patient Name: Harland, Refoel L. Date of Service: 07/14/2016 8:00 AM Medical Record Number: [id number] Patient Account Number: [id number] Date of Birth/Sex: [birth year] (66 y.o. Male) Treating RN: Curtis Sites Primary Care Rihan Schueler: Lanier Ensign Other Clinician: Referring Zamiya Dillard: Daryel November Treating Mickie Kozikowski/Extender: Rudene Re in Treatment: 0 Wound Status Wound Number: 3 Primary Open Surgical Wound Etiology: Wound Location: Right Hand - 2nd Digit Wound Open Wounding Event: Trauma Status: Date Acquired: 05/26/2016 Comorbid Chronic Obstructive Pulmonary Weeks Of Treatment: 0 History: Disease (COPD), Hypertension, Type II Clustered Wound: No Diabetes, Neuropathy Pending Amputation On Presentation Wound Measurements Length: (cm) 2 Width: (cm) 1.5 Depth: (cm) 0.1 Area: (cm) 2.356 Volume: (cm) 0.236 % Reduction in Area: % Reduction in Volume: Epithelialization: None Tunneling: No Undermining: No Wound Description Full Thickness Without Exposed Classification: Support Structures Wound Margin: Flat and Intact Exudate None Present Amount: Foul Odor After  Cleansing: No Slough/Fibrino Yes Wound Bed Granulation Amount: None Present (0%) Exposed Structure Necrotic Amount: Large (67-100%) Fascia Exposed: No Necrotic Quality: Eschar Fat Layer (Subcutaneous Tissue) Exposed: No Tendon Exposed: No Muscle Exposed: No Joint Exposed: No Bone Exposed: No Limited to Skin Breakdown Periwound Skin Texture Texture Color No Abnormalities Noted: No No Abnormalities Noted: No Callus: No Atrophie Blanche: No Crepitus: No Cyanosis: No Excoriation: No Ecchymosis: No Agan, Jacobs L. ([id number]) Induration: No Erythema: No Rash: No Hemosiderin Staining: No Scarring: No Mottled: No Pallor: No Moisture Rubor: No No Abnormalities Noted: No Dry / Scaly: No Temperature / Pain Maceration: No Temperature: No Abnormality Wound Preparation Ulcer  Cleansing: Rinsed/Irrigated with Saline Topical Anesthetic Applied: Other: lidocaine 4%, Electronic Signature(s) Signed: 07/14/2016 9:41:00 AM By: Curtis Sites Entered By: Curtis Sites on 07/14/2016 08:42:47 Pangallo, Brier Elbert Ewings ([id number]) -------------------------------------------------------------------------------- Wound Assessment Details Patient Name: Fiddler, Fordyce L. Date of Service: 07/14/2016 8:00 AM Medical Record Number: [id number] Patient Account Number: [id number] Date of Birth/Sex: 1950-06-15 (66 y.o. Male) Treating RN: Curtis Sites Primary Care Shellee Streng: Lanier Ensign Other Clinician: Referring Noe Goyer: Daryel November Treating Avyan Livesay/Extender: Rudene Re in Treatment: 0 Wound Status Wound Number: 4 Primary Open Surgical Wound Etiology: Wound Location: Right Hand - 3rd Digit Wound Open Wounding Event: Surgical Injury Status: Date Acquired: 05/26/2016 Comorbid Chronic Obstructive Pulmonary Weeks Of Treatment: 0 History: Disease (COPD), Hypertension, Type II Clustered Wound: No Diabetes, Neuropathy Pending Amputation On Presentation Wound Measurements Length: (cm) 2 Width: (cm) 0.6 Depth: (cm) 0.1 Area: (cm) 0.942 Volume: (cm) 0.094 % Reduction in Area: % Reduction in Volume: Epithelialization: None Tunneling: No Undermining: No Wound Description Full Thickness Without Exposed Classification: Support Structures Wound Margin: Flat and Intact Exudate None Present Amount: Foul Odor After Cleansing: No Slough/Fibrino Yes Wound Bed Granulation Amount: None Present (0%) Exposed Structure Necrotic Amount: Large (67-100%) Fascia Exposed: No Necrotic Quality: Eschar, Adherent Slough Fat Layer (Subcutaneous Tissue) Exposed: No Tendon Exposed: No Muscle Exposed: No Joint Exposed: No Bone Exposed: No Limited to Skin Breakdown Periwound Skin Texture Texture Color No Abnormalities Noted: No No Abnormalities Noted: No Callus:  No Atrophie Blanche: No Crepitus: No Cyanosis: No Excoriation: No Ecchymosis: No Jett, Ramaj L. ([id number]) Induration: No Erythema: No Rash: No Hemosiderin Staining: No Scarring: No Mottled: No Pallor: No Moisture Rubor: No No Abnormalities Noted: No Dry / Scaly: No Temperature / Pain Maceration: No Temperature: No Abnormality Wound Preparation Ulcer Cleansing: Rinsed/Irrigated with Saline Topical Anesthetic Applied: Other: lidcoaine 4%, Electronic Signature(s) Signed: 07/14/2016 9:41:00 AM By: Curtis Sites Entered By: Curtis Sites on 07/14/2016 08:43:40 Hackbart, Ivan Anchors ([id number]) -------------------------------------------------------------------------------- Vitals Details Patient Name: Lakey, Valiant L. Date of Service: 07/14/2016 8:00 AM Medical Record Number: [id number] Patient Account Number: [id number] Date of Birth/Sex: [birth year] (66 y.o. Male) Treating RN: Curtis Sites Primary Care Tennelle Taflinger: Lanier Ensign Other Clinician: Referring Clenton Esper: Daryel November Treating Levander Katzenstein/Extender: Rudene Re in Treatment: 0 Vital Signs Time Taken: 08:17 Temperature (F): 98.2 Height (in): 71 Pulse (bpm): 78 Source: Measured Respiratory Rate (breaths/min): 18 Weight (lbs): 183 Blood Pressure (mmHg): 137/91 Source: Measured Reference Range: 80 - 120 mg / dl Body Mass Index (BMI): 25.5 Electronic Signature(s) Signed: 07/14/2016 9:41:00 AM By: Curtis Sites Entered By: Curtis Sites on 07/14/2016 08:20:36

## 2016-07-14 NOTE — Progress Notes (Addendum)
JAHRON, HUNSINGER ([id number]) Visit Report for 07/14/2016 Chief Complaint Document Details Patient Name: Danny [name], Danny [name]. Date of Service: 07/14/2016 8:00 AM Medical Record Number: [id number] Patient Account Number: [id number] Date of Birth/Sex: November 22, 1950 (66 y.o. 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 Date of Service: 07/14/2016 8:00 AM Medical Record Number: [id number] Patient Account Number: [id number] Date of Birth/Sex: 11/20/1950 (66 y.o. Male) Treating RN: Curtis Sites Primary Care Provider: Lanier Ensign Other Clinician: Referring Provider: Daryel November Treating Provider/Extender: Rudene Re in Treatment: 0 Debridement Performed for Wound #2 Right,Lateral Hand - Palm Assessment: Performed By: Physician Evlyn Kanner, MD Debridement: Open Wound/Selective Debridement Selective Description: Pre-procedure Yes - 08:58 Verification/Time Out Taken: Start Time: 08:58 Pain Control: Lidocaine 4% Topical Solution Level: Non-Viable Tissue Total Area Debrided (L x 2.4 (cm) x 2.1 (cm) = 5.04 (cm) W): Tissue and other Viable, Eschar, Fibrin/Slough material debrided: Instrument: Forceps, Scissors Bleeding: None End Time: 09:01 Procedural Pain: 0 Post Procedural Pain: 0 Response to  Treatment: Procedure was tolerated well Post Debridement Measurements of Total Wound Length: (cm) 2.4 Width: (cm) 2.1 Depth: (cm) 1 Volume: (cm) 3.958 Character of Wound/Ulcer Post Improved Debridement: Severity of Tissue Post Debridement: Fat layer exposed Post Procedure Diagnosis Same as Pre-procedure Electronic Signature(s) Signed: 07/14/2016 9:42:08 AM By: Evlyn Kanner MD, FACS Signed: 07/14/2016 4:50:37 PM By: Curtis Sites Previous Signature: 07/14/2016 9:41:00 AM Version By: Sallye Ober, Danny Anchors ([id number]) Entered By: Evlyn Kanner on 07/14/2016 09:42:07 [name], [name] ([id number]) -------------------------------------------------------------------------------- HPI Details Patient Name: [name], [name] Date of Service: 07/14/2016 8:00 AM Medical Record Number: [id number] Patient Account Number: [id number] Date of Birth/Sex: Jan 20, 1951 (66 y.o. Male) Treating RN: Curtis Sites Primary Care Provider: Lanier Ensign Other Clinician: Referring Provider: Daryel November Treating Provider/Extender: Rudene Re in Treatment: 0 History of Present Illness Location: right hand Quality: Patient reports No Pain. Severity: Patient states wound are getting worse. Duration: Patient has had the wound for > 2 months prior to seeking treatment at the wound center Context: The wound occurred when the patient had a fall and had injured his right hand and fractured his wrist and was treated at [facility] for extensive debridement, OR surgery and IV antibiotics Modifying Factors: Consults to this date include:plastic surgery and hand surgery at [facility] Associated Signs and Symptoms: Patient reports having increase swelling the right hand HPI Description: 66 year old male who was seen at the [facility] emergency department on 07/10/2016 for right hand swelling. The patient had a fall and injured his right hand and underwent extensive wound care  and debridement at the [facility] burn center. past medical history significant for diabetes mellitus, hypertension, cardiomyopathy, COPD and is status post hernia repair. He is a former smoker and quit in 2006. a recent x-ray of the right hand showed a comminuted distal radius fracture and an associated ulnar styloid fracture. 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 recent WBC count was 7000 and hemoglobin was 14  g per DL and platelets were 1 16,109 In the ER he was given IV vancomycin and discharged home on clindamycin orally. Review of the electronic medical records -- he was admitted to [facility] between 06/13/2016 and 06/17/2016 under the care of the plastic surgery team for a minimally malaligned distal radius fractured with hand edema and erythema. he was noted to have a large ulcerated lesion on the thenar eminence which had to be debrided at the bedside and there was eschar on the amputation sites of his right second and third finger. Danny [name] is made that he was started on IV antibiotics and discharged on oral clindamycin and Levaquin. In early February he was treated for a distal radius fracture and had a number of ribs fractured and also collapse of his right lung. 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 an MRI done during the admission did not show any osteomyelitis. on 07/07/2016 he was seen in the outpatient orthopedic department where they recommended removing his splint and continuing with physiotherapy. Electronic Signature(s) Signed: 07/14/2016 9:57:30 AM By: Evlyn Kanner MD, FACS Previous Signature: 07/14/2016 9:57:20 AM Version By: Evlyn Kanner MD, FACS Previous Signature: 07/14/2016 9:56:21 AM Version By: Evlyn Kanner MD, FACS Previous Signature: 07/14/2016 9:46:01 AM Version By: Evlyn Kanner  MD, FACS Previous Signature: 07/14/2016 8:25:45 AM Version By: Evlyn Kanner MD, FACS Entered By: Evlyn Kanner on 07/14/2016 09:57:30 Spurlock, Danny Anchors ([id number]) CASSIE, HENKELS ([id number]) -------------------------------------------------------------------------------- Physical Exam Details Patient Name: [name], [name] Date of Service: 07/14/2016 8:00 AM Medical Record Number: [id number] Patient Account Number: [id number] Date of Birth/Sex: 1951-01-14 (66 y.o. Male) Treating RN: Curtis Sites Primary Care Provider: Lanier Ensign Other Clinician: Referring Provider: Daryel November Treating Provider/Extender: Rudene Re in Treatment: 0 Constitutional . Pulse regular. Respirations normal and unlabored. Afebrile. . Eyes Nonicteric. Reactive to light. Ears, Nose, Mouth, and Throat Lips, teeth, and gums WNL.Marland Kitchen Moist mucosa without lesions. Neck supple and nontender. No palpable supraclavicular or cervical adenopathy. Normal sized without goiter. Respiratory WNL. No retractions.. Cardiovascular right radial Pulses WNL. No clubbing, cyanosis or edema. Chest Breasts symmetical and no nipple discharge.. Breast tissue WNL, no masses, lumps, or tenderness.. Gastrointestinal (GI) Abdomen without masses or tenderness.. No liver or spleen enlargement or tenderness.. Lymphatic No adneopathy. No adenopathy. No adenopathy. Musculoskeletal Adexa without tenderness or enlargement.. Digits and nails w/o clubbing, cyanosis, infection, petechiae, ischemia, or inflammatory conditions.. Integumentary (Hair, Skin) No suspicious lesions. No crepitus or fluctuance. No peri-wound warmth or erythema. No masses.Marland Kitchen Psychiatric Judgement and insight Intact.. No evidence of depression, anxiety, or agitation.. Notes the patient has a lot of eschar on the amputation site of his second and third finger. On the thenar eminence of his right palm there is a lot of necrotic debris which is at the  base of the wound which is open and I have used a forceps and scissors and sharply debrided this. No bleeding. Minimal healthy granulation tissue seen. There is no evidence of purulent material or evidence of cellulitis of the hand Electronic Signature(s) Signed: 07/14/2016 9:58:57 AM By: Evlyn Kanner MD, FACS Entered By: Evlyn Kanner on 07/14/2016 09:58:56 Shifflett, Danny Anchors ([id number]) Husser, Danny Anchors ([id number]) -------------------------------------------------------------------------------- Physician Orders Details Patient Name: [name], [name] Date of Service: 07/14/2016 8:00 AM Medical Record Number: [id number] Patient Account Number: [id number] Date of Birth/Sex: May 06, 1950 (65  y.o. Male) Treating RN: Curtis Sites Primary Care Provider: Lanier Ensign Other Clinician: Referring Provider: Daryel November Treating Provider/Extender: Rudene Re in Treatment: 0 Verbal / Phone Orders: No Diagnosis Coding Wound Cleansing Wound #1 Right,Medial Hand - Palm o Clean wound with Normal Saline. Wound #2 Right,Lateral Hand - Palm o Clean wound with Normal Saline. Wound #3 Right Hand - 2nd Digit o Clean wound with Normal Saline. Wound #4 Right Hand - 3rd Digit o Clean wound with Normal Saline. Anesthetic Wound #1 Right,Medial Hand - Palm o Topical Lidocaine 4% cream applied to wound bed prior to debridement Wound #2 Right,Lateral Hand - Palm o Topical Lidocaine 4% cream applied to wound bed prior to debridement Wound #3 Right Hand - 2nd Digit o Topical Lidocaine 4% cream applied to wound bed prior to debridement Wound #4 Right Hand - 3rd Digit o Topical Lidocaine 4% cream applied to wound bed prior to debridement Primary Wound Dressing Wound #1 Right,Medial Hand - Palm o Silvadene Cream o Other: - santyl on lateral palm in [facility] only Wound #2 Right,Lateral Hand - Palm o Silvadene Cream o Other: - santyl on lateral palm in  [facility] only Wound #3 Right Hand - 2nd Digit o Silvadene Cream Petros, Mikael L. ([id number]) o Other: - santyl on lateral palm in [facility] only Wound #4 Right Hand - 3rd Digit o Silvadene Cream o Other: - santyl on lateral palm in [facility] only Secondary Dressing Wound #1 Right,Medial Hand - Palm o Gauze and Kerlix/Conform Wound #2 Right,Lateral Hand - Palm o Gauze and Kerlix/Conform Wound #3 Right Hand - 2nd Digit o Gauze and Kerlix/Conform Wound #4 Right Hand - 3rd Digit o Gauze and Kerlix/Conform Dressing Change Frequency Wound #1 Right,Medial Hand - Palm o Change dressing every day. Wound #2 Right,Lateral Hand - Palm o Change dressing every day. Wound #3 Right Hand - 2nd Digit o Change dressing every day. Wound #4 Right Hand - 3rd Digit o Change dressing every day. Follow-up Appointments Wound #1 Right,Medial Hand - Palm o Return Appointment in 1 week. Wound #2 Right,Lateral Hand - Palm o Return Appointment in 1 week. Wound #3 Right Hand - 2nd Digit o Return Appointment in 1 week. Wound #4 Right Hand - 3rd Digit o Return Appointment in 1 week. Home Health Wound #1 Right,Medial Hand - Palm [facility] for Skilled Nursing AMERICO, VALLERY ([id number]) o Home Health Nurse may visit PRN to address patientos wound care needs. o FACE TO FACE ENCOUNTER: MEDICARE and MEDICAID PATIENTS: I certify that this patient is under my care and that I had a face-to-face encounter that meets the physician face-to-face encounter requirements with this patient on this date. The encounter with the patient was in whole or in part for the following MEDICAL CONDITION: (primary reason for Home Healthcare) MEDICAL NECESSITY: I certify, that based on my findings, NURSING services are a medically necessary home health service. HOME BOUND STATUS: I certify that my clinical findings support that this patient  is homebound (i.e., Due to illness or injury, pt requires aid of supportive devices such as crutches, cane, wheelchairs, walkers, the use of special transportation or the assistance of another person to leave their place of residence. There is a normal inability to leave the home and doing so requires considerable and taxing effort. Other absences are for medical reasons / religious services and are infrequent or of short duration when for other reasons). o  If current dressing causes regression in wound condition, may D/C ordered dressing product/s and apply Normal Saline Moist Dressing daily until next Wound Healing Center / Other MD appointment. Notify Wound Healing Center of regression in wound condition at [phone]. o Please direct any NON-WOUND related issues/requests for orders to patient's Primary Care Physician Wound #2 Right,Lateral Hand - Palm o Initiate Home Health for Skilled Nursing o Home Health Nurse may visit PRN to address patientos wound care needs. o FACE TO FACE ENCOUNTER: MEDICARE and MEDICAID PATIENTS: I certify that this patient is under my care and that I had a face-to-face encounter that meets the physician face-to-face encounter requirements with this patient on this date. The encounter with the patient was in whole or in part for the following MEDICAL CONDITION: (primary reason for Home Healthcare) MEDICAL NECESSITY: I certify, that based on my findings, NURSING services are a medically necessary home health service. HOME BOUND STATUS: I certify that my clinical findings support that this patient is homebound (i.e., Due to illness or injury, pt requires aid of supportive devices such as crutches, cane, wheelchairs, walkers, the use of special transportation or the assistance of another person to leave their place of residence. There is a normal inability to leave the home and doing so requires considerable and taxing effort. Other absences are for  medical reasons / religious services and are infrequent or of short duration when for other reasons). o If current dressing causes regression in wound condition, may D/C ordered dressing product/s and apply Normal Saline Moist Dressing daily until next Wound Healing Center / Other MD appointment. Notify Wound Healing Center of regression in wound condition at [phone]. o Please direct any NON-WOUND related issues/requests for orders to patient's Primary Care Physician Wound #3 Right Hand - 2nd Digit o Initiate Home Health for Skilled Nursing o Home Health Nurse may visit PRN to address patientos wound care needs. o FACE TO FACE ENCOUNTER: MEDICARE and MEDICAID PATIENTS: I certify that this patient is under my care and that I had a face-to-face encounter that meets the physician face-to-face encounter requirements with this patient on this date. The encounter with the patient was in whole or in part for the following MEDICAL CONDITION: (primary reason for Home Healthcare) MEDICAL NECESSITY: I certify, that based on my findings, NURSING services are a medically necessary home health service. HOME BOUND STATUS: I certify that my clinical findings support that this patient is homebound (i.e., Due to illness or injury, pt requires aid of NANA, [place]. ([id number]) supportive devices such as crutches, cane, wheelchairs, walkers, the use of special transportation or the assistance of another person to leave their place of residence. There is a normal inability to leave the home and doing so requires considerable and taxing effort. Other absences are for medical reasons / religious services and are infrequent or of short duration when for other reasons). o If current dressing causes regression in wound condition, may D/C ordered dressing product/s and apply Normal Saline Moist Dressing daily until next Wound Healing Center / Other MD appointment. Notify Wound Healing Center of  regression in wound condition at [phone]. o Please direct any NON-WOUND related issues/requests for orders to patient's Primary Care Physician Wound #4 Right Hand - 3rd Digit o Initiate Home Health for Skilled Nursing o Home Health Nurse may visit PRN to address patientos wound care needs. o FACE TO FACE ENCOUNTER: MEDICARE and MEDICAID PATIENTS: I certify that this patient is under my care and that I had  a face-to-face encounter that meets the physician face-to-face encounter requirements with this patient on this date. The encounter with the patient was in whole or in part for the following MEDICAL CONDITION: (primary reason for Home Healthcare) MEDICAL NECESSITY: I certify, that based on my findings, NURSING services are a medically necessary home health service. HOME BOUND STATUS: I certify that my clinical findings support that this patient is homebound (i.e., Due to illness or injury, pt requires aid of supportive devices such as crutches, cane, wheelchairs, walkers, the use of special transportation or the assistance of another person to leave their place of residence. There is a normal inability to leave the home and doing so requires considerable and taxing effort. Other absences are for medical reasons / religious services and are infrequent or of short duration when for other reasons). o If current dressing causes regression in wound condition, may D/C ordered dressing product/s and apply Normal Saline Moist Dressing daily until next Wound Healing Center / Other MD appointment. Notify Wound Healing Center of regression in wound condition at [phone]. o Please direct any NON-WOUND related issues/requests for orders to patient's Primary Care Physician Medications-please add to medication list. Wound #1 Right,Medial Hand - Palm o Other: - silvadene Wound #2 Right,Lateral Hand - Palm o Other: - silvadene Wound #3 Right Hand - 2nd Digit o Other: -  silvadene Wound #4 Right Hand - 3rd Digit o Other: - silvadene Electronic Signature(s) Signed: 07/14/2016 9:41:00 AM By: Curtis Sites Signed: 07/14/2016 4:12:24 PM By: Evlyn Kanner MD, FACS [place], Danny [name] Kitchen ([id number]) Entered By: Curtis Sites on 07/14/2016 09:06:39 [name], Danny [name] ([id number]) -------------------------------------------------------------------------------- Problem List Details Patient Name: [name], [name] Date of Service: 07/14/2016 8:00 AM Medical Record Number: [id number] Patient Account Number: [id number] Date of Birth/Sex: 1950/06/10 (66 y.o. Male) Treating RN: Curtis Sites Primary Care Provider: Lanier Ensign Other Clinician: Referring Provider: Daryel November Treating Provider/Extender: Rudene Re in Treatment: 0 Active Problems ICD-10 Encounter Code Description Active Date Diagnosis E11.622 Type 2 diabetes mellitus with other skin ulcer 07/14/2016 Yes S61.401A Unspecified open wound of right hand, initial encounter 07/14/2016 Yes T87.81 Dehiscence of amputation stump 07/14/2016 Yes T81.31XA Disruption of external operation (surgical) wound, not 07/14/2016 Yes elsewhere classified, initial encounter Inactive Problems Resolved Problems Electronic Signature(s) Signed: 07/14/2016 9:41:31 AM By: Evlyn Kanner MD, FACS Entered By: Evlyn Kanner on 07/14/2016 09:41:31 [name], Danny [name] ([id number]) -------------------------------------------------------------------------------- Progress Note Details Patient Name: [name], [name] Date of Service: 07/14/2016 8:00 AM Medical Record Number: [id number] Patient Account Number: [id number] Date of Birth/Sex: 23-Jul-1950 (66 y.o. 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 Severity: Patient states wound are getting worse. Duration: Patient has had the wound for > 2 months prior to seeking treatment at the wound center Context: The wound occurred when the patient had a fall and had injured his right hand and fractured his wrist and was treated at [facility] for extensive debridement, OR surgery and IV antibiotics Modifying Factors: Consults to this date include:plastic surgery and hand surgery at [facility]  Associated Signs and Symptoms: Patient reports having increase swelling the right hand 66 year old male who was seen at the [facility] emergency department on 07/10/2016 for right hand swelling. The patient had a fall and injured his right hand and underwent extensive wound care and debridement at the [facility] burn center. past medical history significant for diabetes mellitus, hypertension, cardiomyopathy, COPD and is status post hernia repair. He is a former smoker and quit in 2006. a recent x-ray of the right hand showed a comminuted distal radius fracture and an associated ulnar styloid fracture. 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 recent WBC count was 7000 and hemoglobin was 14 g per DL and platelets were 1 04,540 In the ER he was given IV vancomycin and discharged home on clindamycin orally. Review of the electronic medical records -- he was admitted to [facility] between 06/13/2016 and 06/17/2016 under the care of the plastic surgery team for a minimally malaligned distal radius fractured with hand edema and erythema. he was noted to have a large ulcerated lesion on the  thenar eminence which had to be debrided at the bedside and there was eschar on the amputation sites of his right second and third finger. Danny [name] is made that he was started on IV antibiotics and discharged on oral clindamycin and Levaquin. In early February he was treated for a distal radius fracture and had a number of ribs fractured and also collapse of his right lung. 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 an MRI done during the admission did not show any osteomyelitis. on 07/07/2016 he was seen in the outpatient orthopedic department where they recommended removing his splint and continuing with physiotherapy. JC, VERON ([id number]) Wound History Patient reportedly has not tested positive for osteomyelitis. Patient reportedly has not had testing performed to evaluate circulation in the legs. Patient History Information obtained from Patient. Allergies penicillin Social History Former smoker - quit times 1 year, Marital Status - Single, Alcohol Use - Never, Drug Use - No History, Caffeine Use - Daily. Medical History Respiratory Patient has history of Chronic Obstructive Pulmonary Disease (COPD) Denies history of Aspiration, Asthma, Pneumothorax, Sleep Apnea, Tuberculosis Cardiovascular Patient has history of Hypertension Denies history of Angina, Arrhythmia, Congestive Heart Failure, Coronary Artery Disease, Deep Vein Thrombosis, Hypotension, Myocardial Infarction, Peripheral Arterial Disease, Peripheral Venous Disease, Phlebitis, Vasculitis Gastrointestinal Denies history of Cirrhosis , Colitis, Crohn s, Hepatitis A, Hepatitis B, Hepatitis C Endocrine Patient has history of Type II Diabetes Genitourinary Denies history of End Stage Renal Disease Immunological Denies history of Lupus Erythematosus, Raynaud s,  Scleroderma Integumentary (Skin) Denies history of History of Burn, History of pressure wounds Musculoskeletal Denies history of Gout, Rheumatoid Arthritis, Osteoarthritis, Osteomyelitis Neurologic Patient has history of Neuropathy - left hand since fall Denies history of Dementia, Quadriplegia, Paraplegia, Seizure Disorder Oncologic Denies history of Received Chemotherapy, Received Radiation Patient is treated with Controlled Diet. Medical And Surgical History Notes Cardiovascular cardiomyopathy Review of Systems (ROS) Constitutional Symptoms (General Health) RINGO, SHEROD. ([id number]) The patient has no complaints or symptoms. Eyes The patient has no complaints or symptoms. Ear/Nose/Mouth/Throat The patient has no complaints or symptoms. Hematologic/Lymphatic The patient has no complaints or symptoms. Respiratory The patient has no complaints or symptoms. Cardiovascular The patient has no complaints or symptoms. Gastrointestinal The  patient has no complaints or symptoms. Endocrine The patient has no complaints or symptoms. Genitourinary The patient has no complaints or symptoms. Immunological The patient has no complaints or symptoms. Integumentary (Skin) The patient has no complaints or symptoms. Musculoskeletal The patient has no complaints or symptoms. Neurologic The patient has no complaints or symptoms. Oncologic The patient has no complaints or symptoms. Psychiatric The patient has no complaints or symptoms. Medications lisinopril 20 mg tablet oral tablet oral albuterol sulfate HFA 90 mcg/actuation aerosol inhaler inhalation HFA aerosol inhaler inhalation clindamycin HCl 300 mg capsule oral capsule oral silver sulfadiazine 1 % topical cream topical cream topical isosorbide dinitrate 30 mg tablet oral tablet oral Objective Constitutional Pulse regular. Respirations normal and unlabored. Afebrile. CLIFF, DAMIANI ([id number]) Vitals Time Taken: 8:17  AM, Height: 71 in, Source: Measured, Weight: 183 lbs, Source: Measured, BMI: 25.5, Temperature: 98.2 F, Pulse: 78 bpm, Respiratory Rate: 18 breaths/min, Blood Pressure: 137/91 mmHg. Eyes Nonicteric. Reactive to light. Ears, Nose, Mouth, and Throat Lips, teeth, and gums WNL.Marland Kitchen Moist mucosa without lesions. Neck supple and nontender. No palpable supraclavicular or cervical adenopathy. Normal sized without goiter. Respiratory WNL. No retractions.. Cardiovascular right radial Pulses WNL. No clubbing, cyanosis or edema. Chest Breasts symmetical and no nipple discharge.. Breast tissue WNL, no masses, lumps, or tenderness.. Gastrointestinal (GI) Abdomen without masses or tenderness.. No liver or spleen enlargement or tenderness.. Lymphatic No adneopathy. No adenopathy. No adenopathy. Musculoskeletal Adexa without tenderness or enlargement.. Digits and nails w/o clubbing, cyanosis, infection, petechiae, ischemia, or inflammatory conditions.Marland Kitchen Psychiatric Judgement and insight Intact.. No evidence of depression, anxiety, or agitation.. 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 On the thenar eminence of his right palm there is a lot of necrotic debris which is at the base of the wound which is open and I have used a forceps and scissors and sharply debrided this. No bleeding. Minimal healthy granulation tissue seen. There is no evidence of purulent material or evidence of cellulitis of the hand Integumentary (Hair, Skin) No suspicious lesions. No crepitus or fluctuance. No peri-wound warmth or erythema. No masses.. Wound #1 status is Open. Original cause of wound was Trauma. The wound is located on the Right,Medial Hand - Palm. The wound measures 1.4cm length x 0.3cm width x 0.1cm depth; 0.33cm^2 area and 0.033cm^3 volume. The wound is limited to skin breakdown. There is no tunneling or undermining noted. There is a medium amount of  serous drainage noted. The wound margin is flat and intact. There is small (1-33%) pink granulation within the wound bed. There is a large (67-100%) amount of necrotic tissue within the wound bed including Eschar and Adherent Slough. The periwound skin appearance exhibited: BRECCAN, GALANT. ([id number]) Erythema. The periwound skin appearance did not exhibit: Callus, Crepitus, Excoriation, Induration, Rash, Scarring, Dry/Scaly, Maceration, Atrophie Blanche, Cyanosis, Ecchymosis, Hemosiderin Staining, Mottled, Pallor, Rubor. The surrounding wound skin color is noted with erythema which is circumferential. Periwound temperature was noted as No Abnormality. The periwound has tenderness on palpation. Wound #2 status is Open. Original cause of wound was Trauma. The wound is located on the Right,Lateral Hand - Palm. The wound measures 2.4cm length x 2.1cm width x 1cm depth; 3.958cm^2 area and 3.958cm^3 volume. The wound is limited to skin breakdown. There is no tunneling or undermining noted. There is a large amount of serous drainage noted. The wound margin is flat and intact. There is no granulation within the wound bed. There  is a large (67-100%) amount of necrotic tissue within the wound bed including Eschar and Adherent Slough. The periwound skin appearance exhibited: Erythema. The periwound skin appearance did not exhibit: Callus, Crepitus, Excoriation, Induration, Rash, Scarring, Dry/Scaly, Maceration, Atrophie Blanche, Cyanosis, Ecchymosis, Hemosiderin Staining, Mottled, Pallor, Rubor. The surrounding wound skin color is noted with erythema which is circumferential. Periwound temperature was noted as No Abnormality. The periwound has tenderness on palpation. Wound #3 status is Open. Original cause of wound was Trauma. The wound is located on the Right Hand - 2nd Digit. The wound measures 2cm length x 1.5cm width x 0.1cm depth; 2.356cm^2 area and 0.236cm^3 volume. The wound is limited to skin  breakdown. There is no tunneling or undermining noted. There is a none present amount of drainage noted. The wound margin is flat and intact. There is no granulation within the wound bed. There is a large (67-100%) amount of necrotic tissue within the wound bed including Eschar. The periwound skin appearance did not exhibit: Callus, Crepitus, Excoriation, Induration, Rash, Scarring, Dry/Scaly, Maceration, Atrophie Blanche, Cyanosis, Ecchymosis, Hemosiderin Staining, Mottled, Pallor, Rubor, Erythema. Periwound temperature was noted as No Abnormality. Wound #4 status is Open. Original cause of wound was Surgical Injury. The wound is located on the Right Hand - 3rd Digit. The wound measures 2cm length x 0.6cm width x 0.1cm depth; 0.942cm^2 area and 0.094cm^3 volume. The wound is limited to skin breakdown. There is no tunneling or undermining noted. There is a none present amount of drainage noted. The wound margin is flat and intact. There is no granulation within the wound bed. There is a large (67-100%) amount of necrotic tissue within the wound bed including Eschar and Adherent Slough. The periwound skin appearance did not exhibit: Callus, Crepitus, Excoriation, Induration, Rash, Scarring, Dry/Scaly, Maceration, Atrophie Blanche, Cyanosis, Ecchymosis, Hemosiderin Staining, Mottled, Pallor, Rubor, Erythema. Periwound temperature was noted as No Abnormality. 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 Patient has failed to keep his appointments at [facility] and landed up at the ER at [facility]. Care of his complex hand injuries are beyond the scope of our Wound Care practice but I have tried to help him out by debriding some of the necrotic debris on the thenar eminence of his right palm and have recommended close follow-up by the [facility] plastic surgery department. Phone numbers and addresses were provided to him, as he seems rather helpless. Till then I have recommended he continue with Silvadene ointment locally and wash his hand with soap and water daily to continue local care as advised by the [facility] plastic surgery team. He will come back to see as if he has not been taken care of at the [facility]. I have stressed good control of his diabetes mellitus, proper hygiene and adequate protein, vitamin C, vitamin A and zinc Procedures Wound #2 Wound #2 is a Trauma, Other located on the Right,Lateral Hand - Palm . There was a Non-Viable Tissue Open Wound/Selective [phone]) debridement with total area of 5.04 sq cm performed  by Evlyn Kanner, MD. with the following instrument(s): Forceps and Scissors to remove Viable tissue/material including Fibrin/Slough and Eschar after achieving pain control using Lidocaine 4% Topical Solution. A time out was conducted at 08:58, prior to the start of the procedure. There was no bleeding. The procedure was tolerated well with a pain level of 0 throughout and a pain level of 0 following the procedure. Post Debridement Measurements: 2.4cm length x 2.1cm width x 1cm depth; 3.958cm^3 volume. Character of Wound/Ulcer Post Debridement is improved. Severity of Tissue Post Debridement is: Fat layer exposed. Post procedure Diagnosis Wound #2: Same as Pre-Procedure Plan Wound Cleansing: Wound #1 Right,Medial Hand - Palm: Clean wound with Normal Saline. Wound #2 Right,Lateral Hand - Palm: Clean  wound with Normal Saline. [name], KESTER L. ([id number]) Wound #3 Right Hand - 2nd Digit: Clean wound with Normal Saline. Wound #4 Right Hand - 3rd Digit: Clean wound with Normal Saline. Anesthetic: Wound #1 Right,Medial Hand - Palm: Topical Lidocaine 4% cream applied to wound bed prior to debridement Wound #2 Right,Lateral Hand - Palm: Topical Lidocaine 4% cream applied to wound bed prior to debridement Wound #3 Right Hand - 2nd Digit: Topical Lidocaine 4% cream applied to wound bed prior to debridement Wound #4 Right Hand - 3rd Digit: Topical Lidocaine 4% cream applied to wound bed prior to debridement Primary Wound Dressing: Wound #1 Right,Medial Hand - Palm: Silvadene Cream Other: - santyl on lateral palm in [facility] only Wound #2 Right,Lateral Hand - Palm: Silvadene Cream Other: - santyl on lateral palm in [facility] only Wound #3 Right Hand - 2nd Digit: Silvadene Cream Other: - santyl on lateral palm in [facility] only Wound #4 Right Hand - 3rd Digit: Silvadene Cream Other: - santyl on lateral palm in [facility] only Secondary Dressing: Wound #1 Right,Medial Hand - Palm: Gauze and Kerlix/Conform Wound #2 Right,Lateral Hand - Palm: Gauze and Kerlix/Conform Wound #3 Right Hand - 2nd Digit: Gauze and Kerlix/Conform Wound #4 Right Hand - 3rd Digit: Gauze and Kerlix/Conform Dressing Change Frequency: Wound #1 Right,Medial Hand - Palm: Change dressing every day. Wound #2 Right,Lateral Hand - Palm: Change dressing every day. Wound #3 Right Hand - 2nd Digit: Change dressing every day. Wound #4 Right Hand - 3rd Digit: Change dressing every day. Follow-up Appointments: Wound #1 Right,Medial Hand - Palm: Return Appointment in 1 week. Wound #2 Right,Lateral Hand - Palm: Return Appointment in 1 week. Wound #3 Right Hand - 2nd Digit: Return Appointment in 1 week. ESPEN, BETHEL ([id number]) Wound #4 Right Hand - 3rd  Digit: Return Appointment in 1 week. Home Health: Wound #1 Right,Medial Hand - Palm: Initiate Home Health for Skilled Nursing Home Health Nurse may visit PRN to address patient s wound care needs. FACE TO FACE ENCOUNTER: MEDICARE and MEDICAID PATIENTS: I certify that this patient is under my care and that I had a face-to-face encounter that meets the physician face-to-face encounter requirements with this patient on this date. The encounter with the patient was in whole or in part for the following MEDICAL CONDITION: (primary reason for Home Healthcare) MEDICAL NECESSITY: I certify, that based on my findings, NURSING services are a medically necessary home health service. HOME BOUND STATUS: I certify that my clinical findings support that this patient is homebound (i.e., Due to illness or injury, pt requires aid of supportive devices such as crutches, cane, wheelchairs, walkers, the use of special transportation or the assistance of another person to  leave their place of residence. There is a normal inability to leave the home and doing so requires considerable and taxing effort. Other absences are for medical reasons / religious services and are infrequent or of short duration when for other reasons). If current dressing causes regression in wound condition, may D/C ordered dressing product/s and apply Normal Saline Moist Dressing daily until next Wound Healing Center / Other MD appointment. Notify Wound Healing Center of regression in wound condition at [phone]. Please direct any NON-WOUND related issues/requests for orders to patient's Primary Care Physician Wound #2 Right,Lateral Hand - Palm: Initiate Home Health for Skilled Nursing Home Health Nurse may visit PRN to address patient s wound care needs. FACE TO FACE ENCOUNTER: MEDICARE and MEDICAID PATIENTS: I certify that this patient is under my care and that I had a face-to-face encounter that meets the physician face-to-face  encounter requirements with this patient on this date. The encounter with the patient was in whole or in part for the following MEDICAL CONDITION: (primary reason for Home Healthcare) MEDICAL NECESSITY: I certify, that based on my findings, NURSING services are a medically necessary home health service. HOME BOUND STATUS: I certify that my clinical findings support that this patient is homebound (i.e., Due to illness or injury, pt requires aid of supportive devices such as crutches, cane, wheelchairs, walkers, the use of special transportation or the assistance of another person to leave their place of residence. There is a normal inability to leave the home and doing so requires considerable and taxing effort. Other absences are for medical reasons / religious services and are infrequent or of short duration when for other reasons). If current dressing causes regression in wound condition, may D/C ordered dressing product/s and apply Normal Saline Moist Dressing daily until next Wound Healing Center / Other MD appointment. Notify Wound Healing Center of regression in wound condition at [phone]. Please direct any NON-WOUND related issues/requests for orders to patient's Primary Care Physician Wound #3 Right Hand - 2nd Digit: Initiate Home Health for Skilled Nursing Home Health Nurse may visit PRN to address patient s wound care needs. FACE TO FACE ENCOUNTER: MEDICARE and MEDICAID PATIENTS: I certify that this patient is under my care and that I had a face-to-face encounter that meets the physician face-to-face encounter requirements with this patient on this date. The encounter with the patient was in whole or in part for the following MEDICAL CONDITION: (primary reason for Home Healthcare) MEDICAL NECESSITY: I certify, that based on my findings, NURSING services are a medically necessary home health service. HOME BOUND STATUS: I certify that my clinical findings support that this patient  is homebound (i.e., Due to illness or injury, pt requires aid of supportive devices such as crutches, cane, wheelchairs, walkers, the use of special transportation or the assistance of another person to leave their place of residence. There is a normal inability to leave the home and doing so requires considerable and taxing effort. Other absences are for medical reasons / religious services and are infrequent or of short duration when for other reasons). If current dressing causes regression in wound condition, may D/C ordered dressing product/s and apply Cuadra, Sahith L. ([id number]) Normal Saline Moist Dressing daily until next Wound Healing Center / Other MD appointment. Notify Wound Healing Center of regression in wound condition at [phone]. Please direct any NON-WOUND related issues/requests for orders to patient's Primary Care Physician Wound #4 Right Hand - 3rd Digit: Initiate Home Health for Skilled Nursing Home Health Nurse  may visit PRN to address patient s wound care needs. FACE TO FACE ENCOUNTER: MEDICARE and MEDICAID PATIENTS: I certify that this patient is under my care and that I had a face-to-face encounter that meets the physician face-to-face encounter requirements with this patient on this date. The encounter with the patient was in whole or in part for the following MEDICAL CONDITION: (primary reason for Home Healthcare) MEDICAL NECESSITY: I certify, that based on my findings, NURSING services are a medically necessary home health service. HOME BOUND STATUS: I certify that my clinical findings support that this patient is homebound (i.e., Due to illness or injury, pt requires aid of supportive devices such as crutches, cane, wheelchairs, walkers, the use of special transportation or the assistance of another person to leave their place of residence. There is a normal inability to leave the home and doing so requires considerable and taxing effort. Other absences  are for medical reasons / religious services and are infrequent or of short duration when for other reasons). If current dressing causes regression in wound condition, may D/C ordered dressing product/s and apply Normal Saline Moist Dressing daily until next Wound Healing Center / Other MD appointment. Notify Wound Healing Center of regression in wound condition at [phone]. 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 Patient has failed to keep his appointments at [facility] and landed up at the ER at [facility]. Care of his complex hand injuries are beyond the scope of our Wound Care practice but I have tried to help him out by debriding some of the necrotic debris on the thenar eminence of his right palm and have recommended close follow-up by the [facility] plastic surgery department. Phone numbers and addresses were provided to him, as he seems rather helpless. Till then I have recommended he continue with Silvadene ointment locally and wash his hand with soap and water daily to continue local care as advised by the [facility] plastic surgery team. CRISTAN, SCHERZER ([id number]) He will come back to see as if he has not been taken care of at the [facility]. I have stressed good control of his diabetes mellitus, proper hygiene and adequate protein, vitamin C, vitamin A and zinc Electronic Signature(s) Signed:  07/14/2016 10:02:08 AM By: Evlyn Kanner MD, FACS Entered By: Evlyn Kanner on 07/14/2016 10:02:08 Brock, Danny Anchors ([id number]) -------------------------------------------------------------------------------- ROS/PFSH Details Patient Name: Baroni, Leoncio L. Date of Service: 07/14/2016 8:00 AM Medical Record Number: [id number] Patient Account Number: [id number] Date of Birth/Sex: 02/11/1951 (66 y.o. Male) Treating RN: Curtis Sites Primary Care Provider: Lanier Ensign Other Clinician: Referring Provider: Daryel November Treating Provider/Extender: Rudene Re in Treatment: 0 Information Obtained From Patient Wound History Do you currently have one or more open woundso Yes Approximately how long have you had your woundso 2 months How have you been treating your wound(s) until nowo bandage Has your wound(s) ever healed and then re-openedo No Have you had any lab work done in the past montho  No Have you tested positive for an antibiotic resistant organism (MRSA, VRE)o No Have you tested positive for osteomyelitis (bone infection)o No Have you had any tests for circulation on your legso No Genitourinary Complaints and Symptoms: No Complaints or Symptoms Complaints and Symptoms: Negative for: Kidney failure/ Dialysis; Incontinence/dribbling Medical History: Negative for: End Stage Renal Disease Constitutional Symptoms (General Health) Complaints and Symptoms: No Complaints or Symptoms Eyes Complaints and Symptoms: No Complaints or Symptoms Ear/Nose/Mouth/Throat Complaints and Symptoms: No Complaints or Symptoms Hematologic/Lymphatic Complaints and Symptoms: No Complaints or Symptoms Coval, [place] L. ([id number]) Respiratory Complaints and Symptoms: No Complaints or Symptoms Medical History: Positive for: Chronic Obstructive Pulmonary Disease (COPD) Negative for: Aspiration; Asthma; Pneumothorax; Sleep Apnea; Tuberculosis Cardiovascular Complaints and  Symptoms: No Complaints or Symptoms Medical History: Positive for: Hypertension Negative for: Angina; Arrhythmia; Congestive Heart Failure; Coronary Artery Disease; Deep Vein Thrombosis; Hypotension; Myocardial Infarction; Peripheral Arterial Disease; Peripheral Venous Disease; Phlebitis; Vasculitis Past Medical History Notes: cardiomyopathy Gastrointestinal Complaints and Symptoms: No Complaints or Symptoms Medical History: Negative for: Cirrhosis ; Colitis; Crohnos; Hepatitis A; Hepatitis B; Hepatitis C Endocrine Complaints and Symptoms: No Complaints or Symptoms Medical History: Positive for: Type II Diabetes Treated with: Diet Immunological Complaints and Symptoms: No Complaints or Symptoms Medical History: Negative for: Lupus Erythematosus; Raynaudos; Scleroderma Integumentary (Skin) Complaints and Symptoms: No Complaints or Symptoms Lloyd, Macallister L. ([id number]) Medical History: Negative for: History of Burn; History of pressure wounds Musculoskeletal Complaints and Symptoms: No Complaints or Symptoms Medical History: Negative for: Gout; Rheumatoid Arthritis; Osteoarthritis; Osteomyelitis Neurologic Complaints and Symptoms: No Complaints or Symptoms Medical History: Positive for: Neuropathy - left hand since fall Negative for: Dementia; Quadriplegia; Paraplegia; Seizure Disorder Oncologic Complaints and Symptoms: No Complaints or Symptoms Medical History: Negative for: Received Chemotherapy; Received Radiation Psychiatric Complaints and Symptoms: No Complaints or Symptoms Immunizations Pneumococcal Vaccine: Received Pneumococcal Vaccination: Yes Immunization Notes: up to date Family and Social History Former smoker - quit times 1 year; Marital Status - Single; Alcohol Use: Never; Drug Use: No History; Caffeine Use: Daily; Financial Concerns: No; Food, Clothing or Shelter Needs: No; Support System Lacking: No; Transportation Concerns: No; Advanced  Directives: No; Patient does not want information on Advanced Directives Physician Affirmation I have reviewed and agree with the above information. Electronic Signature(s) TAELON, BENDORF ([id number]) Signed: 07/14/2016 9:41:00 AM By: Curtis Sites Signed: 07/14/2016 4:12:24 PM By: Evlyn Kanner MD, FACS Entered By: Evlyn Kanner on 07/14/2016 09:39:31 Rispoli, Danny Anchors ([id number]) -------------------------------------------------------------------------------- SuperBill Details Patient Name: Railey, Maxton L. Date of Service: 07/14/2016 Medical Record Number: [id number] Patient Account Number: [id number] Date of Birth/Sex: 11-May-1950 (66 y.o. Male) Treating RN: Curtis Sites Primary Care Provider: Lanier Ensign Other Clinician: Referring Provider: Daryel November Treating Provider/Extender: Rudene Re in Treatment: 0 Diagnosis Coding ICD-10 Codes Code Description E11.622 Type 2 diabetes mellitus with other skin ulcer S61.401A Unspecified open wound of right hand, initial encounter T87.81 Dehiscence of amputation stump Disruption of external operation (surgical) wound, not elsewhere classified, initial T81.31XA encounter Facility Procedures CPT4: Description Modifier Quantity Code [id number] - WOUND CARE VISIT-LEV 3 EST PT 1 CPT4: [id number] - DEBRIDE WOUND 1ST 20 SQ CM OR < 1 ICD-10 Description Diagnosis E11.622 Type 2 diabetes mellitus with other skin ulcer S61.401A Unspecified open wound of right hand, initial encounter T81.31XA Disruption of external operation  (surgical) wound, not elsewhere classified, initial encounter Physician Procedures CPT4: Description Modifier Quantity Code [id number] - WC PHYS LEVEL 4 - NEW PT 25 1 ICD-10 Description Diagnosis E11.622 Type 2  diabetes mellitus with other skin ulcer S61.401A Unspecified open wound of right hand, initial encounter T87.81 Dehiscence  of amputation stump T81.31XA Disruption of external operation  (surgical) wound, not elsewhere classified, initial encounter CPT4: [id number] - WC PHYS DEBR WO ANESTH 20 SQ CM 1 Description BENEDETTO, RYDER ([id number]) Electronic Signature(s) Signed: 07/14/2016 10:11:20 AM By: Curtis Sites Signed: 07/14/2016 4:12:24 PM By: Evlyn Kanner MD, FACS Previous Signature: 07/14/2016 10:03:01 AM Version By: Evlyn Kanner MD, FACS Entered By: Curtis Sites on 07/14/2016 10:11:20

## 2016-07-21 ENCOUNTER — Ambulatory Visit: Payer: Medicare Other | Admitting: Surgery

## 2016-07-28 ENCOUNTER — Ambulatory Visit: Payer: Medicare Other | Admitting: Surgery

## 2016-08-04 ENCOUNTER — Ambulatory Visit: Payer: Medicare Other | Admitting: Surgery

## 2017-02-21 ENCOUNTER — Inpatient Hospital Stay
Admission: EM | Admit: 2017-02-21 | Discharge: 2017-02-27 | DRG: 291 | Disposition: A | Discharge status: Skilled Nursing Facility | Payer: Medicare Other | Attending: Internal Medicine | Admitting: Internal Medicine

## 2017-02-21 ENCOUNTER — Other Ambulatory Visit: Payer: Self-pay

## 2017-02-21 ENCOUNTER — Inpatient Hospital Stay: Payer: Medicare Other

## 2017-02-21 ENCOUNTER — Emergency Department: Payer: Medicare Other

## 2017-02-21 DIAGNOSIS — R0602 Shortness of breath: Secondary | ICD-10-CM | POA: Diagnosis present

## 2017-02-21 DIAGNOSIS — J449 Chronic obstructive pulmonary disease, unspecified: Secondary | ICD-10-CM | POA: Diagnosis present

## 2017-02-21 DIAGNOSIS — R0600 Dyspnea, unspecified: Secondary | ICD-10-CM

## 2017-02-21 DIAGNOSIS — I5032 Chronic diastolic (congestive) heart failure: Secondary | ICD-10-CM | POA: Diagnosis not present

## 2017-02-21 DIAGNOSIS — E119 Type 2 diabetes mellitus without complications: Secondary | ICD-10-CM

## 2017-02-21 DIAGNOSIS — Z9119 Patient's noncompliance with other medical treatment and regimen: Secondary | ICD-10-CM

## 2017-02-21 DIAGNOSIS — I429 Cardiomyopathy, unspecified: Secondary | ICD-10-CM

## 2017-02-21 DIAGNOSIS — I959 Hypotension, unspecified: Secondary | ICD-10-CM | POA: Diagnosis present

## 2017-02-21 DIAGNOSIS — Z66 Do not resuscitate: Secondary | ICD-10-CM

## 2017-02-21 DIAGNOSIS — N179 Acute kidney failure, unspecified: Secondary | ICD-10-CM | POA: Diagnosis present

## 2017-02-21 DIAGNOSIS — Z515 Encounter for palliative care: Secondary | ICD-10-CM | POA: Diagnosis not present

## 2017-02-21 DIAGNOSIS — E1122 Type 2 diabetes mellitus with diabetic chronic kidney disease: Secondary | ICD-10-CM | POA: Diagnosis present

## 2017-02-21 DIAGNOSIS — Z880 Allergy status to penicillin: Secondary | ICD-10-CM

## 2017-02-21 DIAGNOSIS — G629 Polyneuropathy, unspecified: Secondary | ICD-10-CM | POA: Diagnosis present

## 2017-02-21 DIAGNOSIS — R945 Abnormal results of liver function studies: Secondary | ICD-10-CM | POA: Diagnosis not present

## 2017-02-21 DIAGNOSIS — I130 Hypertensive heart and chronic kidney disease with heart failure and stage 1 through stage 4 chronic kidney disease, or unspecified chronic kidney disease: Principal | ICD-10-CM | POA: Diagnosis present

## 2017-02-21 DIAGNOSIS — I5023 Acute on chronic systolic (congestive) heart failure: Secondary | ICD-10-CM | POA: Diagnosis present

## 2017-02-21 DIAGNOSIS — Z79899 Other long term (current) drug therapy: Secondary | ICD-10-CM | POA: Diagnosis not present

## 2017-02-21 DIAGNOSIS — Z87891 Personal history of nicotine dependence: Secondary | ICD-10-CM | POA: Diagnosis not present

## 2017-02-21 DIAGNOSIS — E876 Hypokalemia: Secondary | ICD-10-CM | POA: Diagnosis present

## 2017-02-21 DIAGNOSIS — N183 Chronic kidney disease, stage 3 (moderate): Secondary | ICD-10-CM | POA: Diagnosis present

## 2017-02-21 DIAGNOSIS — I4891 Unspecified atrial fibrillation: Secondary | ICD-10-CM | POA: Diagnosis present

## 2017-02-21 DIAGNOSIS — K7200 Acute and subacute hepatic failure without coma: Secondary | ICD-10-CM | POA: Diagnosis present

## 2017-02-21 DIAGNOSIS — B179 Acute viral hepatitis, unspecified: Secondary | ICD-10-CM | POA: Diagnosis present

## 2017-02-21 DIAGNOSIS — I509 Heart failure, unspecified: Secondary | ICD-10-CM

## 2017-02-21 DIAGNOSIS — R7989 Other specified abnormal findings of blood chemistry: Secondary | ICD-10-CM

## 2017-02-21 LAB — COMPREHENSIVE METABOLIC PANEL
ALBUMIN: 3.7 g/dL (ref 3.5–5.0)
ALK PHOS: 78 U/L (ref 38–126)
ALT: 1251 U/L — AB (ref 17–63)
AST: 621 U/L — AB (ref 15–41)
Anion gap: 20 — ABNORMAL HIGH (ref 5–15)
BILIRUBIN TOTAL: 4.6 mg/dL — AB (ref 0.3–1.2)
BUN: 40 mg/dL — ABNORMAL HIGH (ref 6–20)
CHLORIDE: 97 mmol/L — AB (ref 101–111)
CO2: 18 mmol/L — ABNORMAL LOW (ref 22–32)
CREATININE: 1.96 mg/dL — AB (ref 0.61–1.24)
Calcium: 8.5 mg/dL — ABNORMAL LOW (ref 8.9–10.3)
GFR calc Af Amer: 39 mL/min — ABNORMAL LOW (ref >60)
GFR calc non Af Amer: 34 mL/min — ABNORMAL LOW (ref >60)
Glucose, Bld: 123 mg/dL — ABNORMAL HIGH (ref 65–99)
POTASSIUM: 5 mmol/L (ref 3.5–5.1)
Sodium: 135 mmol/L (ref 135–145)
Total Protein: 6.3 g/dL — ABNORMAL LOW (ref 6.5–8.1)

## 2017-02-21 LAB — CBC WITH DIFFERENTIAL/PLATELET
BASOS ABS: 0 10*3/uL (ref 0–0.1)
BASOS PCT: 0 %
Eosinophils Absolute: 0 10*3/uL (ref 0–0.7)
Eosinophils Relative: 0 %
HCT: 48.9 % (ref 40.0–52.0)
HEMOGLOBIN: 15.4 g/dL (ref 13.0–18.0)
LYMPHS PCT: 5 %
Lymphs Abs: 0.4 10*3/uL — ABNORMAL LOW (ref 1.0–3.6)
MCH: 27.9 pg (ref 26.0–34.0)
MCHC: 31.6 g/dL — ABNORMAL LOW (ref 32.0–36.0)
MCV: 88.5 fL (ref 80.0–100.0)
Monocytes Absolute: 0.6 10*3/uL (ref 0.2–1.0)
Monocytes Relative: 7 %
NEUTROS ABS: 7.9 10*3/uL — AB (ref 1.4–6.5)
NEUTROS PCT: 88 %
Platelets: 178 10*3/uL (ref 150–440)
RBC: 5.52 MIL/uL (ref 4.40–5.90)
RDW: 18 % — ABNORMAL HIGH (ref 11.5–14.5)
WBC: 9 10*3/uL (ref 3.8–10.6)

## 2017-02-21 LAB — TSH: TSH: 6.25 u[IU]/mL — AB (ref 0.350–4.500)

## 2017-02-21 LAB — TROPONIN I
TROPONIN I: 0.1 ng/mL — AB (ref <0.03)
Troponin I: 0.11 ng/mL (ref <0.03)

## 2017-02-21 LAB — BRAIN NATRIURETIC PEPTIDE: B NATRIURETIC PEPTIDE 5: 2741 pg/mL — AB (ref 0.0–100.0)

## 2017-02-21 LAB — ETHANOL

## 2017-02-21 LAB — GLUCOSE, CAPILLARY: Glucose-Capillary: 160 mg/dL — ABNORMAL HIGH (ref 65–99)

## 2017-02-21 MED ORDER — ONDANSETRON HCL 4 MG/2ML IJ SOLN: 4.0000 mg | Freq: Four times a day (QID) | INTRAMUSCULAR | Status: DC | PRN

## 2017-02-21 MED ORDER — IPRATROPIUM-ALBUTEROL 0.5-2.5 (3) MG/3ML IN SOLN
3.0000 mL | Freq: Once | RESPIRATORY_TRACT | Status: AC
  Administered 2017-02-21: 3 mL via RESPIRATORY_TRACT
  Filled 2017-02-21: qty 3

## 2017-02-21 MED ORDER — FUROSEMIDE 10 MG/ML IJ SOLN
40.0000 mg | Freq: Two times a day (BID) | INTRAMUSCULAR | Status: DC
  Administered 2017-02-21 – 2017-02-24 (×5): 40 mg via INTRAVENOUS
  Filled 2017-02-21 (×6): qty 4

## 2017-02-21 MED ORDER — INFLUENZA VAC SPLIT HIGH-DOSE 0.5 ML IM SUSY
0.5000 mL | PREFILLED_SYRINGE | INTRAMUSCULAR | Status: DC
  Filled 2017-02-21: qty 0.5

## 2017-02-21 MED ORDER — PNEUMOCOCCAL VAC POLYVALENT 25 MCG/0.5ML IJ INJ: 0.5000 mL | INJECTION | INTRAMUSCULAR | Status: DC

## 2017-02-21 MED ORDER — TIOTROPIUM BROMIDE MONOHYDRATE 18 MCG IN CAPS
18.0000 ug | ORAL_CAPSULE | Freq: Every day | RESPIRATORY_TRACT | Status: DC
  Administered 2017-02-22: 18 ug via RESPIRATORY_TRACT
  Filled 2017-02-21: qty 5

## 2017-02-21 MED ORDER — ASPIRIN 81 MG PO CHEW
324.0000 mg | CHEWABLE_TABLET | Freq: Once | ORAL | Status: AC
  Administered 2017-02-21: 324 mg via ORAL
  Filled 2017-02-21: qty 4

## 2017-02-21 MED ORDER — METOPROLOL TARTRATE 25 MG PO TABS
25.0000 mg | ORAL_TABLET | Freq: Two times a day (BID) | ORAL | Status: DC
  Administered 2017-02-21: 25 mg via ORAL
  Filled 2017-02-21: qty 1

## 2017-02-21 MED ORDER — BISACODYL 10 MG RE SUPP: 10.0000 mg | Freq: Every day | RECTAL | Status: DC | PRN

## 2017-02-21 MED ORDER — PANTOPRAZOLE SODIUM 40 MG IV SOLR
40.0000 mg | Freq: Two times a day (BID) | INTRAVENOUS | Status: DC
  Administered 2017-02-21 – 2017-02-27 (×12): 40 mg via INTRAVENOUS
  Filled 2017-02-21 (×13): qty 40

## 2017-02-21 MED ORDER — NITROGLYCERIN 2 % TD OINT
0.5000 [in_us] | TOPICAL_OINTMENT | Freq: Three times a day (TID) | TRANSDERMAL | Status: DC
  Administered 2017-02-21 – 2017-02-25 (×6): 0.5 [in_us] via TOPICAL
  Filled 2017-02-21 (×7): qty 1

## 2017-02-21 MED ORDER — ORAL CARE MOUTH RINSE
15.0000 mL | Freq: Two times a day (BID) | OROMUCOSAL | Status: DC
  Administered 2017-02-21 – 2017-02-26 (×6): 15 mL via OROMUCOSAL

## 2017-02-21 MED ORDER — INSULIN ASPART 100 UNIT/ML ~~LOC~~ SOLN
0.0000 [IU] | Freq: Three times a day (TID) | SUBCUTANEOUS | Status: DC
  Administered 2017-02-22 (×3): 1 [IU] via SUBCUTANEOUS
  Administered 2017-02-23: 3 [IU] via SUBCUTANEOUS
  Administered 2017-02-23 – 2017-02-24 (×4): 1 [IU] via SUBCUTANEOUS
  Administered 2017-02-24: 2 [IU] via SUBCUTANEOUS
  Administered 2017-02-25 – 2017-02-26 (×2): 3 [IU] via SUBCUTANEOUS
  Filled 2017-02-21 (×12): qty 1

## 2017-02-21 MED ORDER — SODIUM CHLORIDE 0.9% FLUSH
3.0000 mL | INTRAVENOUS | Status: DC | PRN
  Administered 2017-02-22: 3 mL via INTRAVENOUS
  Filled 2017-02-21: qty 3

## 2017-02-21 MED ORDER — FUROSEMIDE 10 MG/ML IJ SOLN
60.0000 mg | Freq: Once | INTRAMUSCULAR | Status: AC
  Administered 2017-02-21: 60 mg via INTRAVENOUS
  Filled 2017-02-21: qty 8

## 2017-02-21 MED ORDER — IPRATROPIUM-ALBUTEROL 0.5-2.5 (3) MG/3ML IN SOLN
3.0000 mL | Freq: Four times a day (QID) | RESPIRATORY_TRACT | Status: DC
  Administered 2017-02-21 – 2017-02-22 (×2): 3 mL via RESPIRATORY_TRACT
  Filled 2017-02-21 (×3): qty 3

## 2017-02-21 MED ORDER — SODIUM CHLORIDE 0.9 % IV SOLN: 250.0000 mL | INTRAVENOUS | Status: DC | PRN

## 2017-02-21 MED ORDER — ENOXAPARIN SODIUM 40 MG/0.4ML ~~LOC~~ SOLN
40.0000 mg | SUBCUTANEOUS | Status: DC
  Administered 2017-02-21 – 2017-02-24 (×4): 40 mg via SUBCUTANEOUS
  Filled 2017-02-21 (×4): qty 0.4

## 2017-02-21 MED ORDER — ONDANSETRON HCL 4 MG PO TABS: 4.0000 mg | ORAL_TABLET | Freq: Four times a day (QID) | ORAL | Status: DC | PRN

## 2017-02-21 MED ORDER — MOMETASONE FURO-FORMOTEROL FUM 200-5 MCG/ACT IN AERO
2.0000 | INHALATION_SPRAY | Freq: Two times a day (BID) | RESPIRATORY_TRACT | Status: DC
  Administered 2017-02-21 – 2017-02-27 (×12): 2 via RESPIRATORY_TRACT
  Filled 2017-02-21: qty 8.8

## 2017-02-21 MED ORDER — FUROSEMIDE 10 MG/ML IJ SOLN
INTRAMUSCULAR | Status: AC
  Administered 2017-02-21: 60 mg via INTRAVENOUS
  Filled 2017-02-21: qty 4

## 2017-02-21 MED ORDER — ACETAMINOPHEN 325 MG PO TABS: 650.0000 mg | ORAL_TABLET | Freq: Four times a day (QID) | ORAL | Status: DC | PRN

## 2017-02-21 MED ORDER — DOCUSATE SODIUM 100 MG PO CAPS
100.0000 mg | ORAL_CAPSULE | Freq: Two times a day (BID) | ORAL | Status: DC
  Administered 2017-02-21 – 2017-02-27 (×11): 100 mg via ORAL
  Filled 2017-02-21 (×11): qty 1

## 2017-02-21 MED ORDER — ACETAMINOPHEN 650 MG RE SUPP: 650.0000 mg | Freq: Four times a day (QID) | RECTAL | Status: DC | PRN

## 2017-02-21 MED ORDER — NITROGLYCERIN 2 % TD OINT: 1.0000 [in_us] | TOPICAL_OINTMENT | Freq: Four times a day (QID) | TRANSDERMAL | Status: DC

## 2017-02-21 MED ORDER — DIGOXIN 125 MCG PO TABS
0.1250 mg | ORAL_TABLET | Freq: Every day | ORAL | Status: DC
  Administered 2017-02-21 – 2017-02-27 (×7): 0.125 mg via ORAL
  Filled 2017-02-21 (×7): qty 1

## 2017-02-21 MED ORDER — SODIUM CHLORIDE 0.9% FLUSH
3.0000 mL | Freq: Two times a day (BID) | INTRAVENOUS | Status: DC
  Administered 2017-02-21 – 2017-02-27 (×9): 3 mL via INTRAVENOUS

## 2017-02-21 NOTE — H&P (Signed)
History and Physical    Danny CanterburyRonnie Lee [name] JXB:[id number] DOB: March 08, 1951 DOA: 02/21/2017  Referring physician: Dr. Alphonzo LemmingsMcShane PCP: Sallee LangeSoles, Willaim RayasMeredith Key, MD  Specialists: none  Chief Complaint: SOB  HPI: Danny [name] is a 66 y.o. male has a past medical history significant for HTN, DM, COPD, and cardiomyopathy now with 1 week hx of progressive SOB and LE edema. Some cough. No fever. Denies CP or palpitations. In ER, pt was edematous with elevated BNP and troponin. CXR shows pulmonary edema and effusions c/w CHF. He is now admitted. Denies cardiac hx. On no meds.  Review of Systems: The patient denies anorexia, fever, weight loss,, vision loss, decreased hearing, hoarseness, chest pain, syncope, balance deficits, hemoptysis, abdominal pain, melena, hematochezia, severe indigestion/heartburn, hematuria, incontinence, genital sores, muscle weakness, suspicious skin lesions, transient blindness, difficulty walking, depression, unusual weight change, abnormal bleeding, enlarged lymph nodes, angioedema, and breast masses.   Past Medical History:  Diagnosis Date  . Cardiomyopathy (HCC)   . COPD (chronic obstructive pulmonary disease) (HCC)   . Hypertension    Past Surgical History:  Procedure Laterality Date  . HERNIA REPAIR     x 3   Social History:  reports that he quit smoking about 12 years ago. His smoking use included cigarettes. he has never used smokeless tobacco. He reports that he does not drink alcohol. His drug history is not on file.  Allergies  Allergen Reactions  . Penicillins     History reviewed. No pertinent family history.  Prior to Admission medications   Medication Sig Start Date End Date Taking? Authorizing Provider  albuterol (VENTOLIN HFA) 108 (90 Base) MCG/ACT inhaler Inhale 3 puffs into the lungs 2 (two) times daily.    [provider]  clindamycin (CLEOCIN) 300 MG capsule Take 1 capsule (300 mg total) by mouth 3 (three) times daily. 07/10/16    Emily FilbertWilliams, Jonathan E, MD  digoxin (LANOXIN) 0.125 MG tablet Take 0.125 mg by mouth daily.    [provider]  isosorbide dinitrate (ISORDIL) 30 MG tablet Take 30 mg by mouth daily.    [provider]  lisinopril (PRINIVIL,ZESTRIL) 20 MG tablet Take 20 mg by mouth daily.    [provider]  silver sulfADIAZINE (SILVADENE) 1 % cream Apply to affected area daily 07/10/16   Emily FilbertWilliams, Jonathan E, MD   Physical Exam: Vitals:   02/21/17 1314 02/21/17 1317  BP: (!) 128/99   Pulse: (!) 124   Resp: (!) 23   Temp: 97.6 F (36.4 C)   TempSrc: Oral   SpO2: 95%   Weight:  102.1 kg (225 lb)  Height:  5\' 10"  (1.778 m)     General:  No apparent distress, Center/AT, WDWN  Eyes: PERRL, EOMI, no scleral icterus, conjunctiva clear  ENT: moist oropharynx without exudate, TM's benign, dentition poor  Neck: supple, no lymphadenopathy. No bruits or thyromegaly  Cardiovascular: rapid rate with regular rhythm without MRG; 2+ peripheral pulses, positive JVD, 3+ peripheral edema  Respiratory: bilateral rales with decreased breath sounds at the bases. No rhonchi or wheezing. Respiratory effort increased  Abdomen: soft, non tender to palpation, positive bowel sounds, no guarding, no rebound  Skin: no rashes or lesions  Musculoskeletal: normal bulk and tone, no joint swelling  Psychiatric: normal mood and affect, A&OX3  Neurologic: CN 2-12 grossly intact, Motor strength 5/5 in all 4 groups with symmetric DTR's and non-focal sensory exam  Labs on Admission:  Basic Metabolic Panel: Recent Labs  Lab 02/21/17 1318  NA  135  K 5.0  CL 97*  CO2 18*  GLUCOSE 123*  BUN 40*  CREATININE 1.96*  CALCIUM 8.5*   Liver Function Tests: Recent Labs  Lab 02/21/17 1318  AST 621*  ALT 1,251*  ALKPHOS 78  BILITOT 4.6*  PROT 6.3*  ALBUMIN 3.7   No results for input(s): LIPASE, AMYLASE in the last 168 hours. No results for input(s): AMMONIA in the last 168 hours. CBC: Recent Labs   Lab 02/21/17 1318  WBC 9.0  NEUTROABS 7.9*  HGB 15.4  HCT 48.9  MCV 88.5  PLT 178   Cardiac Enzymes: Recent Labs  Lab 02/21/17 1318  TROPONINI 0.11*    BNP (last 3 results) Recent Labs    02/21/17 1318  BNP 2,741.0*    ProBNP (last 3 results) No results for input(s): PROBNP in the last 8760 hours.  CBG: No results for input(s): GLUCAP in the last 168 hours.  Radiological Exams on Admission: Dg Chest Port 1 View  Result Date: 02/21/2017 CLINICAL DATA:  Shortness of Breath EXAM: PORTABLE CHEST 1 VIEW COMPARISON:  None. FINDINGS: Cardiomegaly with vascular congestion. Small bilateral pleural effusions, right greater than left with bibasilar opacities, likely atelectasis. No acute bony abnormality. IMPRESSION: Cardiomegaly with vascular congestion. Bilateral effusions and bibasilar atelectasis, right greater than left. Electronically Signed   By: Charlett NoseKevin  Dover M.D.   On: 02/21/2017 13:35    EKG: Independently reviewed.  Assessment/Plan Principal Problem:   CHF (congestive heart failure) (HCC) Active Problems:   Diabetes (HCC)   Cardiomyopathy (HCC)   COPD (chronic obstructive pulmonary disease) (HCC)   Will admit to floor with IV Lasix, NTP, and po lopressor. Follow enzymes and order echo. Consult Cardiology. Optimize pulmonary regimen. O2 if needed. Follow sugars. Repeat labs in AM.  Diet: low salt, low carb Fluids: saline lock DVT Prophylaxis: Lovenox  Code Status: FULL  Family Communication: none  Disposition Plan: home  Time spent: 50 min

## 2017-02-21 NOTE — ED Notes (Signed)
Provided urinal to patient, assisted with urinary elimination

## 2017-02-21 NOTE — ED Notes (Addendum)
Date and time results received: 02/21/17 1413 (use smartphrase ".now" to insert current time)  Test: Troponin Critical Value: 0.11  Name of Provider Notified: Dr. Alphonzo LemmingsMcShane  Orders Received? Or Actions Taken?: Acknowledged

## 2017-02-21 NOTE — ED Triage Notes (Signed)
Shortness of Breath, Lower abdominal pain,  Had not Eaten or had BM              In three days,   Urine       Brown,reportedby EMS  Right hand      Col d and purplish   Hx CHF &     COPD  Sepsis criteria   New cough,                            Brown sputum       Reported by EMS

## 2017-02-21 NOTE — ED Notes (Signed)
Called report to Susan Presto  RN 

## 2017-02-21 NOTE — ED Provider Notes (Addendum)
[facility] Emergency Department Provider Note  ____________________________________________   I have reviewed the triage vital signs and the nursing notes.   HISTORY  Chief Complaint Shortness of Breath    HPI Danny [name] is a 66 y.o. male with a history of CHF, no myopathy with unknown EF, COPD on inhalers, takes only an aspirin and inhalers daily states that over the last several weeks he has had increased lower extremity edema, and increased shortness of breath to the point where he cannot get around.  He states he been short of breath for months but is been getting acutely worse.  Denies any fever.  "Sometimes I cough".  He has had no productive cough.  His legs are significantly more swollen than normal he thinks.  Exertion makes his symptoms worse, he denies chest pain.  Resting sometimes makes his symptoms better.  He cannot answer whether he has orthopnea.  Patient is a very poor historian.     Past Medical History:  Diagnosis Date  . Cardiomyopathy (HCC)   . COPD (chronic obstructive pulmonary disease) (HCC)   . Hypertension     Patient Active Problem List   Diagnosis Date Noted  . Diabetes (HCC) 10/17/2014  . Hypertension 09/06/2014  . Cardiomyopathy (HCC) 09/06/2014  . COPD (chronic obstructive pulmonary disease) (HCC) 02/02/2013    Past Surgical History:  Procedure Laterality Date  . HERNIA REPAIR     x 3    Prior to Admission medications   Medication Sig Start Date End Date Taking? Authorizing Provider  albuterol (VENTOLIN HFA) 108 (90 Base) MCG/ACT inhaler Inhale 3 puffs into the lungs 2 (two) times daily.    [provider]  clindamycin (CLEOCIN) 300 MG capsule Take 1 capsule (300 mg total) by mouth 3 (three) times daily. 07/10/16   Emily FilbertWilliams, Jonathan E, MD  digoxin (LANOXIN) 0.125 MG tablet Take 0.125 mg by mouth daily.    [provider]  isosorbide dinitrate (ISORDIL) 30 MG tablet Take 30 mg by mouth daily.     [provider]  lisinopril (PRINIVIL,ZESTRIL) 20 MG tablet Take 20 mg by mouth daily.    [provider]  silver sulfADIAZINE (SILVADENE) 1 % cream Apply to affected area daily 07/10/16   Emily FilbertWilliams, Jonathan E, MD    Allergies Penicillins  History reviewed. No pertinent family history.  Social History Social History   Tobacco Use  . Smoking status: Former Smoker    Types: Cigarettes    Last attempt to quit: 10/05/2004    Years since quitting: 12.3  Substance Use Topics  . Alcohol use: No  . Drug use: Not on file    Review of Systems Constitutional: No fever/chills Eyes: No visual changes. ENT: No sore throat. No stiff neck no neck pain Cardiovascular: Denies chest pain. Respiratory: Positive shortness of breath. Gastrointestinal:   no vomiting.  No diarrhea.  No constipation. Genitourinary: Negative for dysuria. Musculoskeletal: Positive lower extremity swelling Skin: Negative for rash. Neurological: Negative for severe headaches, focal weakness or numbness.   ____________________________________________   PHYSICAL EXAM:  VITAL SIGNS: ED Triage Vitals  Enc Vitals Group     BP 02/21/17 1314 (!) 128/99     Pulse Rate 02/21/17 1314 (!) 124     Resp 02/21/17 1314 (!) 23     Temp 02/21/17 1314 97.6 F (36.4 C)     Temp Source 02/21/17 1314 Oral     SpO2 02/21/17 1314 95 %     Weight 02/21/17 1317  225 lb (102.1 kg)     Height 02/21/17 1317 5\' 10"  (1.778 m)     Head Circumference --      Peak Flow --      Pain Score --      Pain Loc --      Pain Edu? --      Excl. in GC? --     Constitutional: Alert and oriented. Well appearing and in no acute distress. Eyes: Conjunctivae are normal Head: Atraumatic HEENT: No congestion/rhinnorhea. Mucous membranes are moist.  Oropharynx non-erythematous Neck:   Nontender with no meningismus, no masses, no stridor Cardiovascular: Tachycardia noted rate 115. Grossly normal heart sounds.  Good peripheral  circulation. Respiratory: Limited exam patient refuses to sit up however he is diminished in the bases and has occasional rale, positive mild tachypnea noted. Abdominal: Soft and nontender. No distention. No guarding no rebound Back:  There is no focal tenderness or step off.  there is no midline tenderness there are no lesions noted. there is no CVA tenderness Musculoskeletal: No lower extremity tenderness, no upper extremity tenderness. No joint effusions, no DVT signs strong distal pulses 2+-3+ bilateral pitting symmetric edema Neurologic:  Normal speech and language. No gross focal neurologic deficits are appreciated.  Skin:  Skin is warm, dry and intact.  Excoriations and skin changes in the lower extremities likely consistent with chronic venous stasis. Psychiatric: Mood and affect are normal. Speech and behavior are normal.  ____________________________________________   LABS (all labs ordered are listed, but only abnormal results are displayed)  Labs Reviewed  TROPONIN I  CBC WITH DIFFERENTIAL/PLATELET  COMPREHENSIVE METABOLIC PANEL  ETHANOL  BRAIN NATRIURETIC PEPTIDE    Pertinent labs  results that were available during my care of the patient were reviewed by me and considered in my medical decision making (see chart for details). ____________________________________________  EKG  I personally interpreted any EKGs ordered by me or triage Sinus tachycardia rate 123, old anterior infarct noted, QTC is 518 normal axis, ST elevation or depression, flipped T waves noted in V6 and V5 unsure of the chronicity, no old for comparison ____________________________________________  RADIOLOGY  Pertinent labs & imaging results that were available during my care of the patient were reviewed by me and considered in my medical decision making (see chart for details). If possible, patient and/or family made aware of any abnormal findings.  No results  found. ____________________________________________    PROCEDURES  Procedure(s) performed: None  Procedures  Critical Care performed: CRITICAL CARE Performed by: Jeanmarie Plant   Total critical care time: 45 minutes  Critical care time was exclusive of separately billable procedures and treating other patients.  Critical care was necessary to treat or prevent imminent or life-threatening deterioration.  Critical care was time spent personally by me on the following activities: development of treatment plan with patient and/or surrogate as well as nursing, discussions with consultants, evaluation of patient's response to treatment, examination of patient, obtaining history from patient or surrogate, ordering and performing treatments and interventions, ordering and review of laboratory studies, ordering and review of radiographic studies, pulse oximetry and re-evaluation of patient's condition.   ____________________________________________   INITIAL IMPRESSION / ASSESSMENT AND PLAN / ED COURSE  Pertinent labs & imaging results that were available during my care of the patient were reviewed by me and considered in my medical decision making (see chart for details).  Patient here with significant shortness of breath tachypnea, oxygen saturations are good however, we have initiated a diffuse  workup, broad differential is indicated for this patient including CHF which I think is most likely given his symptoms and exam, as well as COPD or some combination of the 2.  Pneumonia is possible.  Is less likely is ACS or PE but will check troponin and EKG.  Timestamp.b ----------------------------------------- 2:31 PM on 02/21/2017 -----------------------------------------  Patient is still mildly tachycardic, however everything we have checkpoints to CHF including elevated CMP pulmonary edema pulmonary effusion, and borderline troponin which is most likely a demand ischemia he has no  chest pain, we are giving him Lasix, will give him aspirin he has not seen a doctor in over a year we will admit for further care     ____________________________________________   FINAL CLINICAL IMPRESSION(S) / ED DIAGNOSES  Final diagnoses:  SOB (shortness of breath)      This chart was dictated using voice recognition software.  Despite best efforts to proofread,  errors can occur which can change meaning.      Jeanmarie PlantMcShane, James A, MD 02/21/17 1334    Jeanmarie PlantMcShane, James A, MD 02/21/17 [phone]

## 2017-02-22 ENCOUNTER — Inpatient Hospital Stay
Admit: 2017-02-22 | Discharge: 2017-02-22 | Disposition: A | Discharge status: Home / Self Care | Payer: Medicare Other | Attending: Internal Medicine | Admitting: Internal Medicine

## 2017-02-22 LAB — TROPONIN I
Troponin I: 0.1 ng/mL (ref <0.03)
Troponin I: 0.11 ng/mL (ref <0.03)

## 2017-02-22 LAB — CBC
HEMATOCRIT: 42.2 % (ref 40.0–52.0)
HEMOGLOBIN: 13.9 g/dL (ref 13.0–18.0)
MCH: 28.2 pg (ref 26.0–34.0)
MCHC: 32.8 g/dL (ref 32.0–36.0)
MCV: 86 fL (ref 80.0–100.0)
Platelets: 158 10*3/uL (ref 150–440)
RBC: 4.91 MIL/uL (ref 4.40–5.90)
RDW: 17.6 % — ABNORMAL HIGH (ref 11.5–14.5)
WBC: 7.2 10*3/uL (ref 3.8–10.6)

## 2017-02-22 LAB — COMPREHENSIVE METABOLIC PANEL
ALBUMIN: 3 g/dL — AB (ref 3.5–5.0)
ALK PHOS: 69 U/L (ref 38–126)
ALT: 960 U/L — ABNORMAL HIGH (ref 17–63)
ANION GAP: 9 (ref 5–15)
AST: 363 U/L — AB (ref 15–41)
BUN: 40 mg/dL — ABNORMAL HIGH (ref 6–20)
CALCIUM: 7.9 mg/dL — AB (ref 8.9–10.3)
CO2: 25 mmol/L (ref 22–32)
Chloride: 101 mmol/L (ref 101–111)
Creatinine, Ser: 1.45 mg/dL — ABNORMAL HIGH (ref 0.61–1.24)
GFR calc Af Amer: 56 mL/min — ABNORMAL LOW (ref >60)
GFR calc non Af Amer: 48 mL/min — ABNORMAL LOW (ref >60)
GLUCOSE: 123 mg/dL — AB (ref 65–99)
POTASSIUM: 3.2 mmol/L — AB (ref 3.5–5.1)
SODIUM: 135 mmol/L (ref 135–145)
Total Bilirubin: 3.7 mg/dL — ABNORMAL HIGH (ref 0.3–1.2)
Total Protein: 5.2 g/dL — ABNORMAL LOW (ref 6.5–8.1)

## 2017-02-22 LAB — ECHOCARDIOGRAM COMPLETE
Height: 70 in
Weight: 3352 oz

## 2017-02-22 LAB — GLUCOSE, CAPILLARY
GLUCOSE-CAPILLARY: 133 mg/dL — AB (ref 65–99)
GLUCOSE-CAPILLARY: 146 mg/dL — AB (ref 65–99)
Glucose-Capillary: 138 mg/dL — ABNORMAL HIGH (ref 65–99)
Glucose-Capillary: 129 mg/dL — ABNORMAL HIGH (ref 65–99)

## 2017-02-22 LAB — HEMOGLOBIN A1C
Hgb A1c MFr Bld: 5.9 % — ABNORMAL HIGH (ref 4.8–5.6)
MEAN PLASMA GLUCOSE: 122.63 mg/dL

## 2017-02-22 LAB — MAGNESIUM: Magnesium: 1.8 mg/dL (ref 1.7–2.4)

## 2017-02-22 MED ORDER — IPRATROPIUM-ALBUTEROL 0.5-2.5 (3) MG/3ML IN SOLN
3.0000 mL | Freq: Three times a day (TID) | RESPIRATORY_TRACT | Status: DC
  Administered 2017-02-22 – 2017-02-27 (×15): 3 mL via RESPIRATORY_TRACT
  Filled 2017-02-22 (×17): qty 3

## 2017-02-22 MED ORDER — POTASSIUM CHLORIDE CRYS ER 20 MEQ PO TBCR
40.0000 meq | EXTENDED_RELEASE_TABLET | ORAL | Status: AC
  Administered 2017-02-22 (×2): 40 meq via ORAL
  Filled 2017-02-22 (×2): qty 2

## 2017-02-22 MED ORDER — METOPROLOL TARTRATE 25 MG PO TABS
12.5000 mg | ORAL_TABLET | Freq: Two times a day (BID) | ORAL | Status: DC
  Administered 2017-02-23 – 2017-02-27 (×9): 12.5 mg via ORAL
  Filled 2017-02-22 (×11): qty 1

## 2017-02-22 NOTE — Progress Notes (Signed)
Telemetry called to make this RN aware pt had 16 beats non sustained v-tach, pt was getting labs drawn at the time otherwise asymptomatic, Dr. Sheryle Hailiamond made aware, no new orders. Will continue to monitor.

## 2017-02-22 NOTE — Progress Notes (Signed)
[facility] [place] [facility]   PATIENT NAME: Danny [name]    MR#:  [id number]  DATE OF BIRTH:  21-Jul-1950  SUBJECTIVE:  CHIEF COMPLAINT:   Chief Complaint  Patient presents with  . Shortness of Breath    Continues to have shortness of breath, weakness and lower extremity edema. Unable to walk due to swelling in the legs  REVIEW OF SYSTEMS:    Review of Systems  Constitutional: Positive for malaise/fatigue. Negative for chills and fever.  HENT: Negative for sore throat.   Eyes: Negative for blurred vision, double vision and pain.  Respiratory: Positive for shortness of breath. Negative for cough, hemoptysis and wheezing.   Cardiovascular: Positive for orthopnea and leg swelling. Negative for chest pain and palpitations.  Gastrointestinal: Negative for abdominal pain, constipation, diarrhea, heartburn, nausea and vomiting.  Genitourinary: Negative for dysuria and hematuria.  Musculoskeletal: Negative for back pain and joint pain.  Skin: Negative for rash.  Neurological: Positive for weakness. Negative for sensory change, speech change, focal weakness and headaches.  Endo/Heme/Allergies: Does not bruise/bleed easily.  Psychiatric/Behavioral: Negative for depression. The patient is not nervous/anxious.     DRUG ALLERGIES:   Allergies  Allergen Reactions  . Penicillins     VITALS:  Blood pressure (!) 95/49, pulse 89, temperature (!) 97.3 F (36.3 C), temperature source Oral, resp. rate 17, height 5\' 10"  (1.778 m), weight 95 kg (209 lb 8 oz), SpO2 97 %.  PHYSICAL EXAMINATION:   Physical Exam  GENERAL:  66 y.o.-year-old patient lying in the bed with no acute distress.  EYES: Pupils equal, round, reactive to light and accommodation. No scleral icterus. Extraocular muscles intact.  HEENT: Head atraumatic, normocephalic. Oropharynx and nasopharynx clear.  NECK:  Supple, no jugular venous distention. No thyroid enlargement, no tenderness.  LUNGS:  Normal breath sounds bilaterally, no wheezing, rales, rhonchi. No use of accessory muscles of respiration.  CARDIOVASCULAR: S1, S2 normal. No murmurs, rubs, or gallops.  ABDOMEN: Soft, nontender, nondistended. Bowel sounds present. No organomegaly or mass.  EXTREMITIES: Right hand redness, numb. Bilateral lower extremity edema after the ties. NEUROLOGIC: Cranial nerves II through XII are intact. No focal Motor or sensory deficits b/l.   PSYCHIATRIC: The patient is alert and oriented x 3.  SKIN: No obvious rash, lesion, or ulcer.   LABORATORY PANEL:   CBC Recent Labs  Lab 02/22/17 0449  WBC 7.2  HGB 13.9  HCT 42.2  PLT 158   ------------------------------------------------------------------------------------------------------------------ Chemistries  Recent Labs  Lab 02/22/17 0449  NA 135  K 3.2*  CL 101  CO2 25  GLUCOSE 123*  BUN 40*  CREATININE 1.45*  CALCIUM 7.9*  MG 1.8  AST 363*  ALT 960*  ALKPHOS 69  BILITOT 3.7*   ------------------------------------------------------------------------------------------------------------------  Cardiac Enzymes Recent Labs  Lab 02/22/17 0449  TROPONINI 0.11*   ------------------------------------------------------------------------------------------------------------------  RADIOLOGY:  [place] Abdomen Complete  Result Date: 02/21/2017 CLINICAL DATA:  Abnormal LFTs EXAM: ABDOMEN ULTRASOUND COMPLETE COMPARISON:  None. FINDINGS: Gallbladder: No gallstones or wall thickening visualized. No sonographic Murphy sign noted by sonographer. Common bile duct: Diameter: 3.2 mm Liver: No focal mass in the liver. Mild nodular contour to the liver not excluded on provided images. The liver appears subjectively small. Portal vein is patent on color Doppler imaging with normal direction of blood flow towards the liver. IVC: No abnormality visualized. Pancreas: Not visualized. Spleen: Size and appearance within normal limits. Right Kidney: Length:  10 cm. Mildly prominent renal pelvis without caliectasis. Left Kidney:  Length: 10.4 cm. Parapelvic cyst versus extrarenal pelvis. No caliectasis. Abdominal aorta: No aneurysm visualized. Other findings: A right pleural effusion is identified. Ascites is seen throughout the abdomen. The postvoid volume of the bladder is 140 cc. IMPRESSION: 1. Significant ascites in the abdomen and pelvis. 2. Right pleural effusion. 3. The liver appears subjectively small. A subtle nodular contour is not excluded. No focal mass. 4. The pancreas was not visualized. 5. Mildly prominent renal pelvis pelvises with no caliectasis/hydronephrosis. Electronically Signed   By: Gerome Samavid  Williams III M.D   On: 02/21/2017 17:18   Dg Chest Port 1 View  Result Date: 02/21/2017 CLINICAL DATA:  Shortness of Breath EXAM: PORTABLE CHEST 1 VIEW COMPARISON:  None. FINDINGS: Cardiomegaly with vascular congestion. Small bilateral pleural effusions, right greater than left with bibasilar opacities, likely atelectasis. No acute bony abnormality. IMPRESSION: Cardiomegaly with vascular congestion. Bilateral effusions and bibasilar atelectasis, right greater than left. Electronically Signed   By: Charlett NoseKevin  Dover M.D.   On: 02/21/2017 13:35     ASSESSMENT AND PLAN:   *Acute on chronic congestive heart failure.  Unknown if systolic or diastolic. IV Lasix.  Hold metoprolol due to hypotension. Echocardiogram pending. Cardiology consulted. Monitor input and output and daily weight.  *Acute hepatitis likely due to congestion from CHF. Ultrasound of the abdomen showed ascites.  Consult to GI for further input.  *Acute kidney injury over CKD stage III likely due to cardiorenal syndrome.  Improving with diuresis.  *Mild elevation in troponin due to CHF.  Flat trend.  Wait for echocardiogram.  *Hypokalemia.  Replace while being diuresed.  *DVT prophylaxis with Lovenox.  All the records are reviewed and case discussed with Care Management/Social  Workerr. Management plans discussed with the patient, family and they are in agreement.  CODE STATUS: Full code  DVT Prophylaxis: SCDs  TOTAL TIME TAKING CARE OF THIS PATIENT: 30 minutes.   POSSIBLE D/C IN 2-3 DAYS, DEPENDING ON CLINICAL CONDITION.  Orie FishermanSrikar R Kruti Horacek M.D on 02/22/2017 at 9:46 AM  Between 7am to 6pm - Pager - [phone]  After 6pm go to [email or web address] - password [email or web address] [facility]  [facility] [place] [facility]  Office  [phone]  CC: Primary care physician; Judeen HammansSoles, [name], MD  Note: This dictation was prepared with Dragon dictation along with smaller phrase technology. Any transcriptional errors that result from this process are unintentional.

## 2017-02-22 NOTE — Progress Notes (Signed)
*  PRELIMINARY RESULTS* Echocardiogram 2D Echocardiogram has been performed.  Danny [name] 02/22/2017, 8:17 AM

## 2017-02-22 NOTE — Consult Note (Signed)
Reason for Consult: Congestive heart failure and cardiomyopathy Referring Physician: [facility], Dr. Georgie Chard hospitalist Cardiologist Dr. Derryl Harbor  Danny [name] is an 66 y.o. male.  HPI: Patient's a 66 year old white male history of multiple medical problems hypertension diabetes COPD congestive heart failure cardiomyopathy ejection fraction less than 25% started having dyspnea or lower extremity edema over the last week. Patient got progressively worse and finally came to emergency room chest x-ray suggested congestive heart failure pleural effusion denies any chest pain had borderline troponins and elevated BNP. Patient has not been following cardiology regularly. Does not follow a strict salt diet. Has not been evaluated for AICD because of noncompliance. Patient also has chronic injury to his right hand including infection . Neuropathy of right hade.  Past Medical History:  Diagnosis Date  . Cardiomyopathy ([place])   . COPD (chronic obstructive pulmonary disease) ([place])   . Hypertension     Past Surgical History:  Procedure Laterality Date  . HERNIA REPAIR     x 3    History reviewed. No pertinent family history.  Social History:  reports that he quit smoking about 12 years ago. His smoking use included cigarettes. he has never used smokeless tobacco. He reports that he does not drink alcohol. His drug history is not on file.  Allergies:  Allergies  Allergen Reactions  . Penicillins     Medications: I have reviewed the patient's current medications.  Results for orders placed or performed during the hospital encounter of 02/21/17 (from the past 48 hour(s))  Troponin I     Status: Abnormal   Collection Time: 02/21/17  1:18 PM  Result Value Ref Range   Troponin I 0.11 (HH) <0.03 ng/mL    Comment: CRITICAL RESULT CALLED TO, READ BACK BY AND VERIFIED WITH JAY BROOKS 02/21/17 AT 1413 KBH   CBC with Differential     Status: Abnormal   Collection Time: 02/21/17   1:18 PM  Result Value Ref Range   WBC 9.0 3.8 - 10.6 K/uL   RBC 5.52 4.40 - 5.90 MIL/uL   Hemoglobin 15.4 13.0 - 18.0 g/dL   HCT 48.9 40.0 - 52.0 %   MCV 88.5 80.0 - 100.0 fL   MCH 27.9 26.0 - 34.0 pg   MCHC 31.6 (L) 32.0 - 36.0 g/dL   RDW 18.0 (H) 11.5 - 14.5 %   Platelets 178 150 - 440 K/uL   Neutrophils Relative % 88 %   Neutro Abs 7.9 (H) 1.4 - 6.5 K/uL   Lymphocytes Relative 5 %   Lymphs Abs 0.4 (L) 1.0 - 3.6 K/uL   Monocytes Relative 7 %   Monocytes Absolute 0.6 0.2 - 1.0 K/uL   Eosinophils Relative 0 %   Eosinophils Absolute 0.0 0 - 0.7 K/uL   Basophils Relative 0 %   Basophils Absolute 0.0 0 - 0.1 K/uL  Comprehensive metabolic panel     Status: Abnormal   Collection Time: 02/21/17  1:18 PM  Result Value Ref Range   Sodium 135 135 - 145 mmol/L   Potassium 5.0 3.5 - 5.1 mmol/L   Chloride 97 (L) 101 - 111 mmol/L   CO2 18 (L) 22 - 32 mmol/L   Glucose, Bld 123 (H) 65 - 99 mg/dL   BUN 40 (H) 6 - 20 mg/dL   Creatinine, Ser 1.96 (H) 0.61 - 1.24 mg/dL   Calcium 8.5 (L) 8.9 - 10.3 mg/dL   Total Protein 6.3 (L) 6.5 - 8.1 g/dL   Albumin  3.7 3.5 - 5.0 g/dL   AST 621 (H) 15 - 41 U/L   ALT 1,251 (H) 17 - 63 U/L   Alkaline Phosphatase 78 38 - 126 U/L   Total Bilirubin 4.6 (H) 0.3 - 1.2 mg/dL   GFR calc non Af Amer 34 (L) >60 mL/min   GFR calc Af Amer 39 (L) >60 mL/min    Comment: (NOTE) The eGFR has been calculated using the CKD EPI equation. This calculation has not been validated in all clinical situations. eGFR's persistently <60 mL/min signify possible Chronic Kidney Disease.    Anion gap 20 (H) 5 - 15  Ethanol     Status: None   Collection Time: 02/21/17  1:18 PM  Result Value Ref Range   Alcohol, Ethyl (B) <10 <10 mg/dL    Comment:        LOWEST DETECTABLE LIMIT FOR SERUM ALCOHOL IS 10 mg/dL FOR MEDICAL PURPOSES ONLY   Brain natriuretic peptide     Status: Abnormal   Collection Time: 02/21/17  1:18 PM  Result Value Ref Range   B Natriuretic Peptide 2,741.0 (H)  0.0 - 100.0 pg/mL  Hemoglobin A1c     Status: Abnormal   Collection Time: 02/21/17  5:50 PM  Result Value Ref Range   Hgb A1c MFr Bld 5.9 (H) 4.8 - 5.6 %    Comment: (NOTE) Pre diabetes:          5.7%-6.4% Diabetes:              >6.4% Glycemic control for   <7.0% adults with diabetes    Mean Plasma Glucose 122.63 mg/dL    Comment: Performed at [facility], [place] [address], [place], [place] [address]  Troponin I     Status: Abnormal   Collection Time: 02/21/17  5:50 PM  Result Value Ref Range   Troponin I 0.10 (HH) <0.03 ng/mL    Comment: CRITICAL VALUE NOTED. VALUE IS CONSISTENT WITH PREVIOUSLY REPORTED/CALLED VALUE / [place]  TSH     Status: Abnormal   Collection Time: 02/21/17  5:54 PM  Result Value Ref Range   TSH 6.250 (H) 0.350 - 4.500 uIU/mL    Comment: Performed by a 3rd Generation assay with a functional sensitivity of <=0.01 uIU/mL.  Glucose, capillary     Status: Abnormal   Collection Time: 02/21/17  9:29 PM  Result Value Ref Range   Glucose-Capillary 160 (H) 65 - 99 mg/dL  Troponin I     Status: Abnormal   Collection Time: 02/21/17 11:38 PM  Result Value Ref Range   Troponin I 0.10 (HH) <0.03 ng/mL    Comment: CRITICAL VALUE NOTED. VALUE IS CONSISTENT WITH PREVIOUSLY REPORTED/CALLED VALUE [place]  Troponin I     Status: Abnormal   Collection Time: 02/22/17  4:49 AM  Result Value Ref Range   Troponin I 0.11 (HH) <0.03 ng/mL    Comment: CRITICAL VALUE NOTED. VALUE IS CONSISTENT WITH PREVIOUSLY REPORTED/CALLED VALUE. [place].  Comprehensive metabolic panel     Status: Abnormal   Collection Time: 02/22/17  4:49 AM  Result Value Ref Range   Sodium 135 135 - 145 mmol/L   Potassium 3.2 (L) 3.5 - 5.1 mmol/L   Chloride 101 101 - 111 mmol/L   CO2 25 22 - 32 mmol/L   Glucose, Bld 123 (H) 65 - 99 mg/dL   BUN 40 (H) 6 - 20 mg/dL   Creatinine, Ser 1.45 (H) 0.61 - 1.24 mg/dL   Calcium 7.9 (L) 8.9 -  10.3 mg/dL   Total Protein 5.2 (L) 6.5 - 8.1 g/dL   Albumin 3.0 (L) 3.5 - 5.0  g/dL   AST 363 (H) 15 - 41 U/L   ALT 960 (H) 17 - 63 U/L   Alkaline Phosphatase 69 38 - 126 U/L   Total Bilirubin 3.7 (H) 0.3 - 1.2 mg/dL   GFR calc non Af Amer 48 (L) >60 mL/min   GFR calc Af Amer 56 (L) >60 mL/min    Comment: (NOTE) The eGFR has been calculated using the CKD EPI equation. This calculation has not been validated in all clinical situations. eGFR's persistently <60 mL/min signify possible Chronic Kidney Disease.    Anion gap 9 5 - 15  CBC     Status: Abnormal   Collection Time: 02/22/17  4:49 AM  Result Value Ref Range   WBC 7.2 3.8 - 10.6 K/uL   RBC 4.91 4.40 - 5.90 MIL/uL   Hemoglobin 13.9 13.0 - 18.0 g/dL   HCT 42.2 40.0 - 52.0 %   MCV 86.0 80.0 - 100.0 fL   MCH 28.2 26.0 - 34.0 pg   MCHC 32.8 32.0 - 36.0 g/dL   RDW 17.6 (H) 11.5 - 14.5 %   Platelets 158 150 - 440 K/uL  Magnesium     Status: None   Collection Time: 02/22/17  4:49 AM  Result Value Ref Range   Magnesium 1.8 1.7 - 2.4 mg/dL  Glucose, capillary     Status: Abnormal   Collection Time: 02/22/17  9:06 AM  Result Value Ref Range   Glucose-Capillary 138 (H) 65 - 99 mg/dL   Comment 1 Notify RN     US Abdomen Complete  Result Date: 02/21/2017 CLINICAL DATA:  Abnormal LFTs EXAM: ABDOMEN ULTRASOUND COMPLETE COMPARISON:  None. FINDINGS: Gallbladder: No gallstones or wall thickening visualized. No sonographic Murphy sign noted by sonographer. Common bile duct: Diameter: 3.2 mm Liver: No focal mass in the liver. Mild nodular contour to the liver not excluded on provided images. The liver appears subjectively small. Portal vein is patent on color Doppler imaging with normal direction of blood flow towards the liver. IVC: No abnormality visualized. Pancreas: Not visualized. Spleen: Size and appearance within normal limits. Right Kidney: Length: 10 cm. Mildly prominent renal pelvis without caliectasis. Left Kidney: Length: 10.4 cm. Parapelvic cyst versus extrarenal pelvis. No caliectasis. Abdominal aorta: No  aneurysm visualized. Other findings: A right pleural effusion is identified. Ascites is seen throughout the abdomen. The postvoid volume of the bladder is 140 cc. IMPRESSION: 1. Significant ascites in the abdomen and pelvis. 2. Right pleural effusion. 3. The liver appears subjectively small. A subtle nodular contour is not excluded. No focal mass. 4. The pancreas was not visualized. 5. Mildly prominent renal pelvis pelvises with no caliectasis/hydronephrosis. Electronically Signed   By: Dorise Bullion III M.D   On: 02/21/2017 17:18   Dg Chest Port 1 View  Result Date: 02/21/2017 CLINICAL DATA:  Shortness of Breath EXAM: PORTABLE CHEST 1 VIEW COMPARISON:  None. FINDINGS: Cardiomegaly with vascular congestion. Small bilateral pleural effusions, right greater than left with bibasilar opacities, likely atelectasis. No acute bony abnormality. IMPRESSION: Cardiomegaly with vascular congestion. Bilateral effusions and bibasilar atelectasis, right greater than left. Electronically Signed   By: Rolm Baptise M.D.   On: 02/21/2017 13:35    Review of Systems  Constitutional: Positive for malaise/fatigue and weight loss.  HENT: Positive for congestion.   Eyes: Negative.   Respiratory: Positive for shortness of breath.  Cardiovascular: Positive for orthopnea, leg swelling and PND.  Genitourinary: Negative.   Musculoskeletal: Negative.   Skin: Negative.   Neurological: Positive for weakness.  Endo/Heme/Allergies: Negative.   Psychiatric/Behavioral: Negative.    Blood pressure (!) 95/49, pulse 89, temperature (!) 97.3 F (36.3 C), temperature source Oral, resp. rate 17, height 5' 10"  (1.778 m), weight 209 lb 8 oz (95 kg), SpO2 97 %. Physical Exam  Nursing note and vitals reviewed. Constitutional: He is oriented to person, place, and time. He appears well-developed and well-nourished.  HENT:  Head: Normocephalic and atraumatic.  Eyes: Conjunctivae and EOM are normal. Pupils are equal, round, and  reactive to light.  Neck: Normal range of motion. Neck supple.  Cardiovascular: Normal rate, regular rhythm and normal pulses. Exam reveals gallop, S3 and distant heart sounds.  Murmur heard.  Systolic murmur is present with a grade of 2/6. Respiratory: Effort normal. He has decreased breath sounds. He has rhonchi.  GI: Soft. Bowel sounds are normal.  Musculoskeletal: Normal range of motion. He exhibits edema.  Neurological: He is alert and oriented to person, place, and time. He has normal reflexes.  Skin: Skin is warm and dry. There is erythema.  Psychiatric: He has a normal mood and affect.    Assessment/Plan: Congestive heart failure Cardiomyopathy systolic dysfunction Shortness of breath Leg edema COPD Hypertension Former smoker Hypertension Diabetes . Plan Agree with admission to telemetry Agree with IV diuretic therapy Consider starting interest oh for heart failure Okay to maintain to [facility] oxygen as necessary Continue diuretic therapy Inhalers as necessary Acute on chronic renal insufficiency stage 3 Consider switching to [facility] 24/26 in place of prinivil  Consider discontinue Imdur  Rainna Nearhood D Makai Agostinelli 02/22/2017, 11:42 AM

## 2017-02-23 DIAGNOSIS — Z66 Do not resuscitate: Secondary | ICD-10-CM

## 2017-02-23 DIAGNOSIS — I5032 Chronic diastolic (congestive) heart failure: Secondary | ICD-10-CM

## 2017-02-23 DIAGNOSIS — R7989 Other specified abnormal findings of blood chemistry: Secondary | ICD-10-CM

## 2017-02-23 DIAGNOSIS — R945 Abnormal results of liver function studies: Secondary | ICD-10-CM

## 2017-02-23 DIAGNOSIS — J449 Chronic obstructive pulmonary disease, unspecified: Secondary | ICD-10-CM

## 2017-02-23 DIAGNOSIS — Z515 Encounter for palliative care: Secondary | ICD-10-CM

## 2017-02-23 LAB — COMPREHENSIVE METABOLIC PANEL
ALBUMIN: 2.9 g/dL — AB (ref 3.5–5.0)
ALT: 671 U/L — AB (ref 17–63)
AST: 215 U/L — AB (ref 15–41)
Alkaline Phosphatase: 68 U/L (ref 38–126)
Anion gap: 13 (ref 5–15)
BILIRUBIN TOTAL: 3.2 mg/dL — AB (ref 0.3–1.2)
BUN: 30 mg/dL — AB (ref 6–20)
CHLORIDE: 96 mmol/L — AB (ref 101–111)
CO2: 27 mmol/L (ref 22–32)
CREATININE: 1.07 mg/dL (ref 0.61–1.24)
Calcium: 7.6 mg/dL — ABNORMAL LOW (ref 8.9–10.3)
GFR calc Af Amer: >60 mL/min (ref >60)
GLUCOSE: 117 mg/dL — AB (ref 65–99)
Potassium: 3 mmol/L — ABNORMAL LOW (ref 3.5–5.1)
Sodium: 136 mmol/L (ref 135–145)
Total Protein: 5 g/dL — ABNORMAL LOW (ref 6.5–8.1)

## 2017-02-23 LAB — GLUCOSE, CAPILLARY
GLUCOSE-CAPILLARY: 128 mg/dL — AB (ref 65–99)
GLUCOSE-CAPILLARY: 232 mg/dL — AB (ref 65–99)
GLUCOSE-CAPILLARY: 144 mg/dL — AB (ref 65–99)
Glucose-Capillary: 122 mg/dL — ABNORMAL HIGH (ref 65–99)
Glucose-Capillary: 166 mg/dL — ABNORMAL HIGH (ref 65–99)

## 2017-02-23 LAB — HEPATITIS PANEL, ACUTE
HCV Ab: <0.1 s/co ratio (ref 0.0–0.9)
HEP B C IGM: NEGATIVE
HEP B S AG: NEGATIVE
Hep A IgM: NEGATIVE

## 2017-02-23 MED ORDER — POTASSIUM CHLORIDE CRYS ER 20 MEQ PO TBCR
40.0000 meq | EXTENDED_RELEASE_TABLET | ORAL | Status: AC
  Administered 2017-02-23 (×2): 40 meq via ORAL
  Filled 2017-02-23 (×2): qty 2

## 2017-02-23 MED ORDER — DIPHENHYDRAMINE HCL 25 MG PO CAPS
25.0000 mg | ORAL_CAPSULE | Freq: Every evening | ORAL | Status: DC | PRN
  Administered 2017-02-23 – 2017-02-26 (×2): 25 mg via ORAL
  Filled 2017-02-23 (×2): qty 1

## 2017-02-23 MED ORDER — DIGOXIN 0.25 MG/ML IJ SOLN
0.2500 mg | Freq: Once | INTRAMUSCULAR | Status: AC
  Administered 2017-02-23: 0.25 mg via INTRAVENOUS
  Filled 2017-02-23 (×2): qty 1

## 2017-02-23 NOTE — Progress Notes (Signed)
Pt requesting medicine to help with sleep. MD notified, awaiting orders. Will continue to monitor and assess.

## 2017-02-23 NOTE — Progress Notes (Signed)
Chaplain received an order to visit with pt in room 244. Chaplain provided information for an advanced directive.    02/23/17 1900  Clinical Encounter Type  Visited With Patient  Visit Type Initial  Referral From Nurse  Consult/Referral To Chaplain  Spiritual Encounters  Spiritual Needs Other (Comment)

## 2017-02-23 NOTE — Consult Note (Signed)
Consultation Note Date: 02/23/2017   Patient Name: Danny CanterburyRonnie Lee [name]  DOB: 1950-12-13  MRN: [id number]  Age / Sex: 66 y.o., male  PCP: Danny [name] Referring Physician: Milagros LollSudini, Srikar, [name]  Reason for Consultation: Establishing goals of care  HPI/Patient Profile: 66 y.o. male admitted on 02/21/2017 with past medical history significant for HTN, DM, COPD, and cardiomyopathy now with 1 week hx of progressive SOB and LE edema. Some cough. No fever. Denies CP or palpitations.  In ER, pt was edematous with elevated BNP and troponin. CXR shows pulmonary edema and effusions c/w CHF. He is now admitted. Denies cardiac hx. On no meds.  Patient and his SO speak to a  stressed social environment secondary to financial burdens.  No other family support, "we only have each other".     Chart review reflects "non-compliance", perhaps impacted by psycho-social barriers.  Patient faces treatment options decisions,  Advanced directive decisions and anticipatory care need.    Clinical Assessment and Goals of Care:  This NP Danny [name] reviewed medical records, received report from team, assessed the patient and then meet at the patient's bedside along with SO/Danny [name]  to discuss diagnosis, prognosis, GOC, EOL wishes disposition and options.  A detailed discussion was had today regarding advanced directives.  Concepts specific to code status, artifical feeding and hydration, continued IV antibiotics and rehospitalization was had.  The difference between a aggressive medical intervention path  and a palliative comfort care path for this patient at this time was had.  Values and goals of care important to patient and family were attempted to be elicited.  Concept of  Palliative Care was discussed   Questions and concerns addressed.   PMT will continue to support holistically.    PATIENT is his own medical  decision maker at this time, however they are requesting to secure HPOA while hospitalized, I will trigger spiritual care consult    SUMMARY OF RECOMMENDATIONS    At this time patient is open to all offered and available medical interventions to improve quality of life. SO supports this goal and verbalizes her support in assisting in compliance with treatment plans and recommended medication regime once patient is discharged.  Code Status/Advance Care Planning:  DNR--documented today, supported by significant other  Palliative Prophylaxis:   Aspiration, Bowel Regimen, Delirium Protocol, Frequent Pain Assessment and Oral Care  Psycho-social/Spiritual:   Desire for further Chaplaincy support:yes- to assist with securing HPOA  Additional Recommendations: discussed case with CMRN, hopefully supplemental services will be available to augment discharge plan     Prognosis:   Unable to determine  Discharge Planning: To Be Determined      Primary Diagnoses: Present on Admission: . COPD (chronic obstructive pulmonary disease) ([facility])   I have reviewed the medical record, interviewed the patient and family, and examined the patient. The following aspects are pertinent.  Past Medical History:  Diagnosis Date  . Cardiomyopathy ([facility])   . COPD (chronic obstructive pulmonary disease) ([facility])   . Hypertension  Social History   Socioeconomic History  . Marital status: Single    Spouse name: None  . Number of children: None  . Years of education: None  . Highest education level: None  Social Needs  . Financial resource strain: None  . Food insecurity - worry: None  . Food insecurity - inability: None  . Transportation needs - medical: None  . Transportation needs - non-medical: None  Occupational History  . None  Tobacco Use  . Smoking status: Former Smoker    Types: Cigarettes    Last attempt to quit: 10/05/2004    Years since quitting: 12.3  . Smokeless tobacco: Never Used    Substance and Sexual Activity  . Alcohol use: No  . Drug use: None  . Sexual activity: None  Other Topics Concern  . None  Social History Narrative  . None   History reviewed. No pertinent family history. Scheduled Meds: . digoxin  0.125 mg Oral Daily  . docusate sodium  100 mg Oral BID  . enoxaparin (LOVENOX) injection  40 mg Subcutaneous Q24H  . furosemide  40 mg Intravenous Q12H  . Influenza vac split quadrivalent PF  0.5 mL Intramuscular Tomorrow-1000  . insulin aspart  0-9 Units Subcutaneous TID WC  . ipratropium-albuterol  3 mL Nebulization TID  . mouth rinse  15 mL Mouth Rinse BID  . metoprolol tartrate  12.5 mg Oral BID  . mometasone-formoterol  2 puff Inhalation BID  . nitroGLYCERIN  0.5 inch Topical Q8H  . pantoprazole (PROTONIX) IV  40 mg Intravenous Q12H  . pneumococcal 23 valent vaccine  0.5 mL Intramuscular Tomorrow-1000  . potassium chloride  40 mEq Oral Q4H  . sodium chloride flush  3 mL Intravenous Q12H   Continuous Infusions: . sodium chloride     PRN Meds:.sodium chloride, acetaminophen **OR** acetaminophen, bisacodyl, diphenhydrAMINE, ondansetron **OR** ondansetron (ZOFRAN) IV, sodium chloride flush Medications Prior to Admission:  Prior to Admission medications   Medication Sig Start Date End Date Taking? Authorizing Provider  aspirin EC 81 MG tablet Take 81 mg daily by mouth.   Yes Provider, Historical, [name]  Tiotropium Bromide-Olodaterol (STIOLTO RESPIMAT) 2.5-2.5 MCG/ACT AERS Inhale 1 puff daily into the lungs.   Yes Provider, Historical, [name]  silver sulfADIAZINE (SILVADENE) 1 % cream Apply to affected area daily 07/10/16   Emily FilbertWilliams, Jonathan E, [name]   Allergies  Allergen Reactions  . Penicillins    Review of Systems  Respiratory: Positive for cough.   Neurological: Positive for weakness.    Physical Exam  Constitutional: He is oriented to person, place, and time. He appears ill. Nasal cannula in place.  - disheveled   Cardiovascular: Tachycardia  present.  Pulmonary/Chest: He has decreased breath sounds in the right lower field and the left lower field. He has rhonchi.  Neurological: He is alert and oriented to person, place, and time.  Skin: Skin is warm and dry.    Vital Signs: BP 99/72 (BP Location: Right Arm)   Pulse (!) 124   Temp 97.7 F (36.5 C) (Oral)   Resp 18   Ht 5\' 10"  (1.778 m)   Wt 90.4 kg (199 lb 4.8 oz)   SpO2 97%   BMI 28.60 kg/m  Pain Assessment: 0-10 POSS *See Group Information*: 1-Acceptable,Awake and alert Pain Score: 0-No pain   SpO2: SpO2: 97 % O2 Device:SpO2: 97 % O2 Flow Rate: .O2 Flow Rate (L/min): 2 L/min  IO: Intake/output summary:   Intake/Output Summary (Last 24 hours) at 02/23/2017 1340  Last data filed at 02/23/2017 1131 Gross per 24 hour  Intake -  Output 3700 ml  Net -3700 ml    LBM: Last BM Date: 02/21/17 Baseline Weight: Weight: 102.1 kg (225 lb) Most recent weight: Weight: 90.4 kg (199 lb 4.8 oz)     Palliative Assessment/Data:  30%   Discussed with Dr Elpidio Anis     I will f/u with patient in the morning  Time In: 1320 Time Out: 1420 Time Total: 60 minutes  Greater than 50%  of this time was spent counseling and coordinating care related to the above assessment and plan.  Signed by: Danny Creed, NP   Please contact Palliative Medicine Team phone at [phone] for questions and concerns.  For individual provider: See Loretha Stapler

## 2017-02-23 NOTE — Progress Notes (Signed)
CH rounding unit and stopped by the patients room. Patient was with medical staff and was tired. CH offered silent prayer and will follow-up with patient during afternoon rounding. Patient was speaking with medical team. CH provided privacy and prayer.

## 2017-02-23 NOTE — Consult Note (Signed)
Danny [name] Danny Burgo, MD Danny [name]  [address] Danny [name], [place] [address] Phone: [phone] Fax : [phone]  Consultation  Referring Provider:     Dr. Elpidio AnisSudini Primary Care Physician:  Danny HammansSoles, Danny Key, MD Primary Gastroenterologist:  Danny FitzUnassigned         Reason for Consultation:     Abnormal liver enzymes  Date of Admission:  02/21/2017 Date of Consultation:  02/23/2017         HPI:   Danny CanterburyRonnie Lee [name] is a 66 y.o. male who was admitted with congestive heart failure and was found to have abnormal liver enzymes. The patient had ALT of 1251 with an AST of 621 [name] admission. The patient's alkaline phosphatase was normal but his total bilirubin was elevated at 4.6. The patient's liver enzymes have come down to a ALT of 671 with an AST of 2:15 and his bilirubin is 3.2 with alkaline phosphatase remaining normal. The patient was also found to have an elevated troponin in addition to his congestive heart failure. The patient denies ever having been told he had abnormal liver enzymes in the past or any underlying liver disease. The patient did have an ultrasound [name] admission that showed him to have significant ascites of the abdomen and pelvis with a right pleural effusion and a nodular contour. He does report that he used to drink but not daily and cannot quantify how much he was drinking. He states he quit drinking 6 months ago.    Past Medical History:  Diagnosis Date  . Cardiomyopathy (HCC)   . COPD (chronic obstructive pulmonary disease) (HCC)   . Hypertension     Past Surgical History:  Procedure Laterality Date  . HERNIA REPAIR     x 3    Prior to Admission medications   Medication Sig Start Date End Date Taking? Authorizing Provider  aspirin EC 81 MG tablet Take 81 mg daily by mouth.   Yes [provider]  Tiotropium Bromide-Olodaterol (STIOLTO RESPIMAT) 2.5-2.5 MCG/ACT AERS Inhale 1 puff daily into the lungs.   Yes [provider]  silver sulfADIAZINE  (SILVADENE) 1 % cream Apply to affected area daily 07/10/16   Emily FilbertWilliams, Jonathan E, MD    History reviewed. No pertinent family history.   Social History   Tobacco Use  . Smoking status: Former Smoker    Types: Cigarettes    Last attempt to quit: 10/05/2004    Years since quitting: 12.3  . Smokeless tobacco: Never Used  Substance Use Topics  . Alcohol use: No  . Drug use: Not [name] file    Allergies as of 02/21/2017 - Review Complete 02/21/2017  Allergen Reaction Noted  . Penicillins  10/17/2014    Review of Systems:    All systems reviewed and negative except where noted in HPI.   Physical Exam:  Vital signs in last 24 hours: Temp:  [97.7 F (36.5 C)-98.1 F (36.7 C)] 97.7 F (36.5 C) (11/19 0804) Pulse Rate:  [65-124] 124 (11/19 0804) Resp:  [18-20] 18 (11/19 0612) BP: (98-116)/(61-86) 99/72 (11/19 0804) SpO2:  [95 %-97 %] 97 % (11/19 0804) Weight:  [199 lb 4.8 oz (90.4 kg)] 199 lb 4.8 oz (90.4 kg) (11/19 0500) Last BM Date: 02/18/17 General:   Pleasant, cooperative in NAD Head:  Normocephalic and atraumatic. Eyes:   No icterus.   Conjunctiva pink. PERRLA. Ears:  Normal auditory acuity. Neck:  Supple; no masses or thyroidomegaly Lungs: Respirations even and unlabored. Lungs with crackles bilaterally.   No  wheezes or rhonchi.  Heart:  Regular rate and rhythm;  Without murmur, clicks, rubs or gallops Abdomen:  Soft, nondistended, nontender. Normal bowel sounds. No appreciable masses or hepatomegaly.  No rebound or guarding.  Rectal:  Not performed. Msk:  Symmetrical without gross deformities.    Extremities:  Patient has +2 pitting edema [name] the lower extremities, cyanosis or clubbing. Neurologic:  Alert and oriented x3;  grossly normal neurologically. Skin:  Intact without significant lesions or rashes. Cervical Nodes:  No significant cervical adenopathy. Psych:  Alert and cooperative. Normal affect.  LAB RESULTS: Recent Labs    02/21/17 1318 02/22/17 0449  WBC 9.0  7.2  HGB 15.4 13.9  HCT 48.9 42.2  PLT 178 158   BMET Recent Labs    02/21/17 1318 02/22/17 0449 02/23/17 0534  NA 135 135 136  K 5.0 3.2* 3.0*  CL 97* 101 96*  CO2 18* 25 27  GLUCOSE 123* 123* 117*  BUN 40* 40* 30*  CREATININE 1.96* 1.45* 1.07  CALCIUM 8.5* 7.9* 7.6*   LFT Recent Labs    02/23/17 0534  PROT 5.0*  ALBUMIN 2.9*  AST 215*  ALT 671*  ALKPHOS 68  BILITOT 3.2*   PT/INR No results for input(s): LABPROT, INR in the last 72 hours.  STUDIES: [place] Abdomen Complete  Result Date: 02/21/2017 CLINICAL DATA:  Abnormal LFTs EXAM: ABDOMEN ULTRASOUND COMPLETE COMPARISON:  None. FINDINGS: Gallbladder: No gallstones or wall thickening visualized. No sonographic Murphy sign noted by sonographer. Common bile duct: Diameter: 3.2 mm Liver: No focal mass in the liver. Mild nodular contour to the liver not excluded [name] provided images. The liver appears subjectively small. Portal vein is patent [name] color Doppler imaging with normal direction of blood flow towards the liver. IVC: No abnormality visualized. Pancreas: Not visualized. Spleen: Size and appearance within normal limits. Right Kidney: Length: 10 cm. Mildly prominent renal pelvis without caliectasis. Left Kidney: Length: 10.4 cm. Parapelvic cyst versus extrarenal pelvis. No caliectasis. Abdominal aorta: No aneurysm visualized. Other findings: A right pleural effusion is identified. Ascites is seen throughout the abdomen. The postvoid volume of the bladder is 140 cc. IMPRESSION: 1. Significant ascites in the abdomen and pelvis. 2. Right pleural effusion. 3. The liver appears subjectively small. A subtle nodular contour is not excluded. No focal mass. 4. The pancreas was not visualized. 5. Mildly prominent renal pelvis pelvises with no caliectasis/hydronephrosis. Electronically Signed   By: Gerome Samavid  Williams III M.D   [name]: 02/21/2017 17:18   Dg Chest Port 1 View  Result Date: 02/21/2017 CLINICAL DATA:  Shortness of Breath EXAM:  PORTABLE CHEST 1 VIEW COMPARISON:  None. FINDINGS: Cardiomegaly with vascular congestion. Small bilateral pleural effusions, right greater than left with bibasilar opacities, likely atelectasis. No acute bony abnormality. IMPRESSION: Cardiomegaly with vascular congestion. Bilateral effusions and bibasilar atelectasis, right greater than left. Electronically Signed   By: Charlett NoseKevin  Dover M.D.   [name]: 02/21/2017 13:35      Impression / Plan:   Danny CanterburyRonnie Lee [name] is a 66 y.o. y/o male with abnormal liver enzymes [name] admission with the level being over thousand. The patient has a history of alcohol use but these levels are too high for a alcoholic hepatitis picture. The patient likely has the CHF as the cause of his abnormal liver enzymes with hypoperfusion. With the patient's liver enzymes over thousand the most likely diagnosis is viral versus medication versus shock liver. With the rapid improvement over the last few days it is more likely due  to hypoperfusion and shock liver. I will order labs to make sure there is nothing else going [name] with his liver but will keep monitoring and treating as shock liver with maximizing perfusion by treating the CHF.   Thank you for involving me in the care of this patient.      LOS: 2 days   Danny Minium, MD  02/23/2017, 9:39 AM   Note: This dictation was prepared with Dragon dictation along with smaller phrase technology. Any transcriptional errors that result from this process are unintentional.

## 2017-02-23 NOTE — Significant Event (Signed)
Rapid Response Event Note  Overview: Time Called: 1730 Arrival Time: 1733 Event Type: Cardiac  Initial Focused Assessment: Pt sitting up in bed. A&Ox3. Pt is calm and cooperative. Pt denies sob and pain at this time. Prior to this RN arrival, pt states he had chest discomfort and SOB. Current BP 110/82, HR 120-130 AFib, RR 19 regular and unlabored. O2 99% on 4L [place]. Pt lung sounds are clear, lower lobes are diminished. Radial, DP Pulses 2+. Pt states "I just got scared".    Interventions: Prior to this RN arrival, floor RN administered Lasix 40mg  IV per MD order. Per floor RN, order being placed for one time dose of Dig. RR monitors applied to pt. Pt monitored appx 30 mins. Pt VS and mentation remained stable.   Plan of Care (if not transferred): Pt responding to IV Lasix, floor RN to administer one time dose of Dig, Floor RN to monitor BP and HR. Pt takes home dose of PO dig. RR RN to be notified if any changes.   Event Summary: Name of Physician Notified: Sudini at 1736    at    Outcome: Stayed in room and stabalized  Event End Time: 1815  Waynard EdwardsJamie C Smith

## 2017-02-23 NOTE — Progress Notes (Signed)
[facility] - [place] [facility]   PATIENT NAME: Danny [name]    MR#:  [id number]  DATE OF BIRTH:  January 10, 1951  SUBJECTIVE:  CHIEF COMPLAINT:   Chief Complaint  Patient presents with  . Shortness of Breath    Continues to have shortness of breath, weakness and lower extremity edema.  ON 2 L O2  REVIEW OF SYSTEMS:    Review of Systems  Constitutional: Positive for malaise/fatigue. Negative for chills and fever.  HENT: Negative for sore throat.   Eyes: Negative for blurred vision, double vision and pain.  Respiratory: Positive for shortness of breath. Negative for cough, hemoptysis and wheezing.   Cardiovascular: Positive for orthopnea and leg swelling. Negative for chest pain and palpitations.  Gastrointestinal: Negative for abdominal pain, constipation, diarrhea, heartburn, nausea and vomiting.  Genitourinary: Negative for dysuria and hematuria.  Musculoskeletal: Negative for back pain and joint pain.  Skin: Negative for rash.  Neurological: Positive for weakness. Negative for sensory change, speech change, focal weakness and headaches.  Endo/Heme/Allergies: Does not bruise/bleed easily.  Psychiatric/Behavioral: Negative for depression. The patient is not nervous/anxious.     DRUG ALLERGIES:   Allergies  Allergen Reactions  . Penicillins     VITALS:  Blood pressure 99/72, pulse (!) 124, temperature 97.7 F (36.5 C), temperature source Oral, resp. rate 18, height 5\' 10"  (1.778 m), weight 90.4 kg (199 lb 4.8 oz), SpO2 97 %.  PHYSICAL EXAMINATION:   Physical Exam  GENERAL:  66 y.o.-year-old patient lying in the bed with no acute distress.  EYES: Pupils equal, round, reactive to light and accommodation. No scleral icterus. Extraocular muscles intact.  HEENT: Head atraumatic, normocephalic. Oropharynx and nasopharynx clear.  NECK:  Supple, no jugular venous distention. No thyroid enlargement, no tenderness.  LUNGS: Normal breath sounds bilaterally,  no wheezing, rales, rhonchi. No use of accessory muscles of respiration.  CARDIOVASCULAR: S1, S2 normal. No murmurs, rubs, or gallops.  ABDOMEN: Soft, nontender, nondistended. Bowel sounds present. No organomegaly or mass.  EXTREMITIES: Right hand redness, numb. Bilateral lower extremity edema after the ties. NEUROLOGIC: Cranial nerves II through XII are intact. No focal Motor or sensory deficits b/l.   PSYCHIATRIC: The patient is alert and oriented x 3.  SKIN: No obvious rash, lesion, or ulcer.   LABORATORY PANEL:   CBC Recent Labs  Lab 02/22/17 0449  WBC 7.2  HGB 13.9  HCT 42.2  PLT 158   ------------------------------------------------------------------------------------------------------------------ Chemistries  Recent Labs  Lab 02/22/17 0449 02/23/17 0534  NA 135 136  K 3.2* 3.0*  CL 101 96*  CO2 25 27  GLUCOSE 123* 117*  BUN 40* 30*  CREATININE 1.45* 1.07  CALCIUM 7.9* 7.6*  MG 1.8  --   AST 363* 215*  ALT 960* 671*  ALKPHOS 69 68  BILITOT 3.7* 3.2*   ------------------------------------------------------------------------------------------------------------------  Cardiac Enzymes Recent Labs  Lab 02/22/17 0449  TROPONINI 0.11*   ------------------------------------------------------------------------------------------------------------------  RADIOLOGY:  [place] Abdomen Complete  Result Date: 02/21/2017 CLINICAL DATA:  Abnormal LFTs EXAM: ABDOMEN ULTRASOUND COMPLETE COMPARISON:  None. FINDINGS: Gallbladder: No gallstones or wall thickening visualized. No sonographic Murphy sign noted by sonographer. Common bile duct: Diameter: 3.2 mm Liver: No focal mass in the liver. Mild nodular contour to the liver not excluded on provided images. The liver appears subjectively small. Portal vein is patent on color Doppler imaging with normal direction of blood flow towards the liver. IVC: No abnormality visualized. Pancreas: Not visualized. Spleen: Size and appearance  within normal limits. Right  Kidney: Length: 10 cm. Mildly prominent renal pelvis without caliectasis. Left Kidney: Length: 10.4 cm. Parapelvic cyst versus extrarenal pelvis. No caliectasis. Abdominal aorta: No aneurysm visualized. Other findings: A right pleural effusion is identified. Ascites is seen throughout the abdomen. The postvoid volume of the bladder is 140 cc. IMPRESSION: 1. Significant ascites in the abdomen and pelvis. 2. Right pleural effusion. 3. The liver appears subjectively small. A subtle nodular contour is not excluded. No focal mass. 4. The pancreas was not visualized. 5. Mildly prominent renal pelvis pelvises with no caliectasis/hydronephrosis. Electronically Signed   By: Gerome Samavid  Williams III M.D   On: 02/21/2017 17:18   Dg Chest Port 1 View  Result Date: 02/21/2017 CLINICAL DATA:  Shortness of Breath EXAM: PORTABLE CHEST 1 VIEW COMPARISON:  None. FINDINGS: Cardiomegaly with vascular congestion. Small bilateral pleural effusions, right greater than left with bibasilar opacities, likely atelectasis. No acute bony abnormality. IMPRESSION: Cardiomegaly with vascular congestion. Bilateral effusions and bibasilar atelectasis, right greater than left. Electronically Signed   By: Charlett NoseKevin  Dover M.D.   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   Cardiology consulted and appreciate input Monitor input and output and daily weight. Hypotension limiting entresto use  *Acute hepatitis likely due to congestion from CHF. Ultrasound of the abdomen showed ascites.   Gi on board. Labs ordered and pending  *Acute kidney injury over CKD stage III likely due to cardiorenal syndrome.  Improving with diuresis.  *Mild elevation in troponin due to CHF.  Flat trend.    *Hypokalemia.  Replace while being diuresed.  *DVT prophylaxis with Lovenox.  All the records are reviewed and case discussed  with Care Management/Social Worker Management plans discussed with the patient, family and they are in agreement.  CODE STATUS: Full code  DVT Prophylaxis: SCDs  TOTAL TIME TAKING CARE OF THIS PATIENT: 30 minutes.   POSSIBLE D/C IN 2-3 DAYS, DEPENDING ON CLINICAL CONDITION.  Orie FishermanSrikar R Keysi Oelkers M.D on 02/23/2017 at 10:28 AM  Between 7am to 6pm - Pager - [phone]  After 6pm go to [email or web address] - password [email or web address] [facility]  [facility] [place] [facility]  Office  [phone]  CC: Primary care physician; Judeen HammansSoles, [name], MD  Note: This dictation was prepared with Dragon dictation along with smaller phrase technology. Any transcriptional errors that result from this process are unintentional.

## 2017-02-23 NOTE — Plan of Care (Signed)
  Progressing Skin Integrity: Risk for impaired skin integrity will decrease 02/23/2017 0352 - Progressing by Stefan Churchogers, Krithi Bray M, RN

## 2017-02-23 NOTE — Progress Notes (Addendum)
Patient states that he does not feel well wants to go to the ER states that he feels like he is suffocating. RR called IV lasix was given 20 min before RR called. Team arrived BP, SPO2, Respirations and temp stable. Heart rate elevated in the 130's. Dr. Elpidio AnisSudini notified orders received for Digoxin .25mg . Patient states that he feels better that he was anxious and scared. Vital signs remain stable. No orders to transfer at this time. Will continue to monitor.

## 2017-02-23 NOTE — Discharge Instructions (Signed)
Heart Failure Clinic appointment on March 05 2017 at 8:20am with Clarisa Kindredina Kizer Nobbe, FNP. Please call [phone] to reschedule.

## 2017-02-24 LAB — COMPREHENSIVE METABOLIC PANEL
ALK PHOS: 74 U/L (ref 38–126)
ALT: 539 U/L — ABNORMAL HIGH (ref 17–63)
AST: 153 U/L — ABNORMAL HIGH (ref 15–41)
Albumin: 3.1 g/dL — ABNORMAL LOW (ref 3.5–5.0)
Anion gap: 13 (ref 5–15)
BILIRUBIN TOTAL: 2.9 mg/dL — AB (ref 0.3–1.2)
BUN: 26 mg/dL — AB (ref 6–20)
CALCIUM: 7.5 mg/dL — AB (ref 8.9–10.3)
CO2: 31 mmol/L (ref 22–32)
CREATININE: 1.18 mg/dL (ref 0.61–1.24)
Chloride: 93 mmol/L — ABNORMAL LOW (ref 101–111)
GFR calc Af Amer: >60 mL/min (ref >60)
GLUCOSE: 121 mg/dL — AB (ref 65–99)
POTASSIUM: 3.1 mmol/L — AB (ref 3.5–5.1)
Sodium: 137 mmol/L (ref 135–145)
TOTAL PROTEIN: 5.5 g/dL — AB (ref 6.5–8.1)

## 2017-02-24 LAB — GLUCOSE, CAPILLARY
GLUCOSE-CAPILLARY: 146 mg/dL — AB (ref 65–99)
GLUCOSE-CAPILLARY: 127 mg/dL — AB (ref 65–99)
GLUCOSE-CAPILLARY: 135 mg/dL — AB (ref 65–99)
Glucose-Capillary: 174 mg/dL — ABNORMAL HIGH (ref 65–99)

## 2017-02-24 LAB — IRON AND TIBC
IRON: 17 ug/dL — AB (ref 45–182)
Saturation Ratios: 6 % — ABNORMAL LOW (ref 17.9–39.5)
TIBC: 302 ug/dL (ref 250–450)
UIBC: 285 ug/dL

## 2017-02-24 LAB — PROTIME-INR
INR: 1.52
PROTHROMBIN TIME: 18.2 s — AB (ref 11.4–15.2)

## 2017-02-24 LAB — FERRITIN: Ferritin: 82 ng/mL (ref 24–336)

## 2017-02-24 LAB — MITOCHONDRIAL ANTIBODIES: Mitochondrial M2 Ab, IgG: 13.6 Units (ref 0.0–20.0)

## 2017-02-24 MED ORDER — FUROSEMIDE 10 MG/ML IJ SOLN
20.0000 mg | Freq: Two times a day (BID) | INTRAMUSCULAR | Status: DC
  Administered 2017-02-24 (×2): 20 mg via INTRAVENOUS
  Filled 2017-02-24 (×2): qty 2

## 2017-02-24 MED ORDER — POTASSIUM CHLORIDE CRYS ER 20 MEQ PO TBCR
40.0000 meq | EXTENDED_RELEASE_TABLET | ORAL | Status: AC
  Administered 2017-02-24 (×2): 40 meq via ORAL
  Filled 2017-02-24 (×2): qty 2

## 2017-02-24 NOTE — Progress Notes (Addendum)
[facility] - [place] [facility]   PATIENT NAME: Danny [name]    MR#:  [id number]  DATE OF BIRTH:  [birth year]  SUBJECTIVE:  CHIEF COMPLAINT:   Chief Complaint  Patient presents with  . Shortness of Breath    Continues to have shortness of breath, weakness and lower extremity edema.  ON 2 L O2  Some improvement in LE edema Afib with RVR with severe SOB  REVIEW OF SYSTEMS:    Review of Systems  Constitutional: Positive for malaise/fatigue. Negative for chills and fever.  HENT: Negative for sore throat.   Eyes: Negative for blurred vision, double vision and pain.  Respiratory: Positive for shortness of breath. Negative for cough, hemoptysis and wheezing.   Cardiovascular: Positive for orthopnea and leg swelling. Negative for chest pain and palpitations.  Gastrointestinal: Negative for abdominal pain, constipation, diarrhea, heartburn, nausea and vomiting.  Genitourinary: Negative for dysuria and hematuria.  Musculoskeletal: Negative for back pain and joint pain.  Skin: Negative for rash.  Neurological: Positive for weakness. Negative for sensory change, speech change, focal weakness and headaches.  Endo/Heme/Allergies: Does not bruise/bleed easily.  Psychiatric/Behavioral: Negative for depression. The patient is not nervous/anxious.     DRUG ALLERGIES:   Allergies  Allergen Reactions  . Penicillins     VITALS:  Blood pressure (!) 158/132, pulse (!) 107, temperature 97.8 F (36.6 C), temperature source Oral, resp. rate 20, height 5\' 10"  (1.778 m), weight 84.8 kg (186 lb 14.4 oz), SpO2 (!) 87 %.  PHYSICAL EXAMINATION:   Physical Exam  GENERAL:  66 y.o.-year-old patient lying in the bed with no acute distress.  EYES: Pupils equal, round, reactive to light and accommodation. No scleral icterus. Extraocular muscles intact.  HEENT: Head atraumatic, normocephalic. Oropharynx and nasopharynx clear.  NECK:  Supple, no jugular venous distention. No  thyroid enlargement, no tenderness.  LUNGS: Normal breath sounds bilaterally, no wheezing, rales, rhonchi. No use of accessory muscles of respiration.  CARDIOVASCULAR: S1, S2 normal. No murmurs, rubs, or gallops.  ABDOMEN: Soft, nontender, nondistended. Bowel sounds present. No organomegaly or mass.  EXTREMITIES: Right hand redness, numb. Bilateral lower extremity edema after the thighs. NEUROLOGIC: Cranial nerves II through XII are intact. No focal Motor or sensory deficits b/l.   PSYCHIATRIC: The patient is alert and oriented x 3.  SKIN: No obvious rash, lesion, or ulcer.   LABORATORY PANEL:   CBC Recent Labs  Lab 02/22/17 0449  WBC 7.2  HGB 13.9  HCT 42.2  PLT 158   ------------------------------------------------------------------------------------------------------------------ Chemistries  Recent Labs  Lab 02/22/17 0449  02/24/17 0331  NA 135   < > 137  K 3.2*   < > 3.1*  CL 101   < > 93*  CO2 25   < > 31  GLUCOSE 123*   < > 121*  BUN 40*   < > 26*  CREATININE 1.45*   < > 1.18  CALCIUM 7.9*   < > 7.5*  MG 1.8  --   --   AST 363*   < > 153*  ALT 960*   < > 539*  ALKPHOS 69   < > 74  BILITOT 3.7*   < > 2.9*   < > = values in this interval not displayed.   ------------------------------------------------------------------------------------------------------------------  Cardiac Enzymes Recent Labs  Lab 02/22/17 0449  TROPONINI 0.11*   ------------------------------------------------------------------------------------------------------------------  RADIOLOGY:  No results found.   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 Hypotension limiting entresto use  *Acute hepatitis likely due to congestion  from CHF. Ultrasound of the abdomen showed ascites.   Gi on board.  Improving  *Acute kidney injury over CKD stage III likely due to cardiorenal syndrome.  Improving with diuresis.  *Mild elevation in troponin due to CHF.  Flat trend.    *Hypokalemia.  Replace while being diuresed.  *DVT prophylaxis with Lovenox.  All the records are reviewed and case discussed with Care Management/Social Worker Management plans discussed with the patient, family and they are in agreement.  CODE STATUS: Full code  DVT Prophylaxis: SCDs  TOTAL CC TIME TAKING CARE OF THIS PATIENT: 35 minutes.   POSSIBLE D/C IN 2-3 DAYS, DEPENDING ON CLINICAL CONDITION.  Molinda BailiffSrikar R Ann Groeneveld M.D on 02/24/2017 at 11:05 AM  Between 7am to 6pm - Pager - [phone]  After 6pm go to [email or web address] - password [email or web address] [facility]  [facility] [place] [facility]  Office  [phone]  CC: Primary care physician; Judeen HammansSoles, [name], MD  Note: This dictation was prepared with Dragon dictation along with smaller phrase technology. Any transcriptional errors that result from this process are unintentional.

## 2017-02-25 ENCOUNTER — Encounter: Payer: Self-pay | Admitting: Internal Medicine

## 2017-02-25 LAB — GLUCOSE, CAPILLARY
GLUCOSE-CAPILLARY: 117 mg/dL — AB (ref 65–99)
Glucose-Capillary: 112 mg/dL — ABNORMAL HIGH (ref 65–99)
Glucose-Capillary: 217 mg/dL — ABNORMAL HIGH (ref 65–99)
Glucose-Capillary: 130 mg/dL — ABNORMAL HIGH (ref 65–99)

## 2017-02-25 LAB — AMMONIA: Ammonia: 18 umol/L (ref 9–35)

## 2017-02-25 LAB — CBC
HCT: 39.6 % — ABNORMAL LOW (ref 40.0–52.0)
HEMOGLOBIN: 12.6 g/dL — AB (ref 13.0–18.0)
MCH: 27.8 pg (ref 26.0–34.0)
MCHC: 31.9 g/dL — ABNORMAL LOW (ref 32.0–36.0)
MCV: 87.2 fL (ref 80.0–100.0)
PLATELETS: 104 10*3/uL — AB (ref 150–440)
RBC: 4.54 MIL/uL (ref 4.40–5.90)
RDW: 18 % — ABNORMAL HIGH (ref 11.5–14.5)
WBC: 6.9 10*3/uL (ref 3.8–10.6)

## 2017-02-25 LAB — IGG, IGA, IGM
IGA: 268 mg/dL (ref 61–437)
IGG (IMMUNOGLOBIN G), SERUM: 728 mg/dL (ref 700–1600)
IgM (Immunoglobulin M), Srm: 122 mg/dL (ref 20–172)

## 2017-02-25 LAB — BASIC METABOLIC PANEL
ANION GAP: 12 (ref 5–15)
BUN: 25 mg/dL — ABNORMAL HIGH (ref 6–20)
CHLORIDE: 96 mmol/L — AB (ref 101–111)
CO2: 31 mmol/L (ref 22–32)
Calcium: 7.4 mg/dL — ABNORMAL LOW (ref 8.9–10.3)
Creatinine, Ser: 0.92 mg/dL (ref 0.61–1.24)
GFR calc Af Amer: >60 mL/min (ref >60)
GLUCOSE: 128 mg/dL — AB (ref 65–99)
POTASSIUM: 3.5 mmol/L (ref 3.5–5.1)
Sodium: 139 mmol/L (ref 135–145)

## 2017-02-25 LAB — ANTINUCLEAR ANTIBODIES, IFA: ANTINUCLEAR ANTIBODIES, IFA: NEGATIVE

## 2017-02-25 LAB — ALPHA-1-ANTITRYPSIN: A-1 Antitrypsin, Ser: 182 mg/dL (ref 90–200)

## 2017-02-25 LAB — MAGNESIUM: Magnesium: 1.5 mg/dL — ABNORMAL LOW (ref 1.7–2.4)

## 2017-02-25 LAB — AFP TUMOR MARKER: AFP, SERUM, TUMOR MARKER: 4.7 ng/mL (ref 0.0–8.3)

## 2017-02-25 LAB — CERULOPLASMIN: Ceruloplasmin: 27.2 mg/dL (ref 16.0–31.0)

## 2017-02-25 LAB — ANTI-SMOOTH MUSCLE ANTIBODY, IGG: F-ACTIN AB IGG: 7 U (ref 0–19)

## 2017-02-25 MED ORDER — FUROSEMIDE 10 MG/ML IJ SOLN
40.0000 mg | Freq: Two times a day (BID) | INTRAMUSCULAR | Status: DC
  Administered 2017-02-25 – 2017-02-27 (×5): 40 mg via INTRAVENOUS
  Filled 2017-02-25 (×5): qty 4

## 2017-02-25 MED ORDER — APIXABAN 5 MG PO TABS
5.0000 mg | ORAL_TABLET | Freq: Two times a day (BID) | ORAL | Status: DC
  Administered 2017-02-25 – 2017-02-27 (×5): 5 mg via ORAL
  Filled 2017-02-25 (×5): qty 1

## 2017-02-25 MED ORDER — MAGNESIUM SULFATE 2 GM/50ML IV SOLN
2.0000 g | Freq: Once | INTRAVENOUS | Status: AC
  Administered 2017-02-25: 2 g via INTRAVENOUS
  Filled 2017-02-25: qty 50

## 2017-02-25 MED ORDER — POTASSIUM CHLORIDE CRYS ER 20 MEQ PO TBCR
40.0000 meq | EXTENDED_RELEASE_TABLET | ORAL | Status: AC
  Administered 2017-02-25 (×2): 40 meq via ORAL
  Filled 2017-02-25 (×2): qty 2

## 2017-02-25 MED ORDER — MAGNESIUM OXIDE 400 (241.3 MG) MG PO TABS
400.0000 mg | ORAL_TABLET | Freq: Every day | ORAL | Status: DC
  Administered 2017-02-25 – 2017-02-27 (×3): 400 mg via ORAL
  Filled 2017-02-25 (×3): qty 1

## 2017-02-25 NOTE — Progress Notes (Signed)
[facility] - [place] at [facility]   PATIENT NAME: Danny [name]    MR#:  [id number]  DATE OF BIRTH:  [birth year]  SUBJECTIVE:  CHIEF COMPLAINT:   Chief Complaint  Patient presents with  . Shortness of Breath    SOB better. Feels weak  On 2 L O2  Edema improving  REVIEW OF SYSTEMS:    Review of Systems  Constitutional: Positive for malaise/fatigue. Negative for chills and fever.  HENT: Negative for sore throat.   Eyes: Negative for blurred vision, double vision and pain.  Respiratory: Positive for shortness of breath. Negative for cough, hemoptysis and wheezing.   Cardiovascular: Positive for orthopnea and leg swelling. Negative for chest pain and palpitations.  Gastrointestinal: Negative for abdominal pain, constipation, diarrhea, heartburn, nausea and vomiting.  Genitourinary: Negative for dysuria and hematuria.  Musculoskeletal: Negative for back pain and joint pain.  Skin: Negative for rash.  Neurological: Positive for weakness. Negative for sensory change, speech change, focal weakness and headaches.  Endo/Heme/Allergies: Does not bruise/bleed easily.  Psychiatric/Behavioral: Negative for depression. The patient is not nervous/anxious.     DRUG ALLERGIES:   Allergies  Allergen Reactions  . Penicillins     VITALS:  Blood pressure 113/81, pulse 90, temperature 98.1 F (36.7 C), temperature source Oral, resp. rate 18, height 5\' 10"  (1.778 m), weight 81.9 kg (180 lb 8 oz), SpO2 98 %.  PHYSICAL EXAMINATION:   Physical Exam  GENERAL:  66 y.o.-year-old patient lying in the bed with no acute distress.  EYES: Pupils equal, round, reactive to light and accommodation. No scleral icterus. Extraocular muscles intact.  HEENT: Head atraumatic, normocephalic. Oropharynx and nasopharynx clear.  NECK:  Supple, no jugular venous distention. No thyroid enlargement, no tenderness.  LUNGS: Normal breath sounds bilaterally, no wheezing, rales, rhonchi. No use of  accessory muscles of respiration.  CARDIOVASCULAR: S1, S2 normal. No murmurs, rubs, or gallops.  ABDOMEN: Soft, nontender, nondistended. Bowel sounds present. No organomegaly or mass.  EXTREMITIES: Right hand redness, numb. Bilateral lower extremity edema after the thighs. NEUROLOGIC: Cranial nerves II through XII are intact. No focal Motor or sensory deficits b/l.   PSYCHIATRIC: The patient is alert and oriented x 3.  SKIN: No obvious rash, lesion, or ulcer.   LABORATORY PANEL:   CBC Recent Labs  Lab 02/22/17 0449  WBC 7.2  HGB 13.9  HCT 42.2  PLT 158   ------------------------------------------------------------------------------------------------------------------ Chemistries  Recent Labs  Lab 02/24/17 0331 02/25/17 0328  NA 137 139  K 3.1* 3.5  CL 93* 96*  CO2 31 31  GLUCOSE 121* 128*  BUN 26* 25*  CREATININE 1.18 0.92  CALCIUM 7.5* 7.4*  MG  --  1.5*  AST 153*  --   ALT 539*  --   ALKPHOS 74  --   BILITOT 2.9*  --    ------------------------------------------------------------------------------------------------------------------  Cardiac Enzymes Recent Labs  Lab 02/22/17 0449  TROPONINI 0.11*   ------------------------------------------------------------------------------------------------------------------  RADIOLOGY:  No results found.   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 Hypotension limiting entresto use  *Acute hepatitis likely due to congestion from CHF. Ultrasound of the abdomen showed ascites.   Gi on board.  Improving  *Acute kidney injury over CKD stage III likely  due to cardiorenal syndrome.  Resolved  *Mild elevation in troponin due to CHF.  Flat trend.     *Hypokalemia and hypomagnesemia.  Replace while being diuresed.  *DVT prophylaxis with Lovenox.  All the records are reviewed and case discussed with Care Management/Social Worker Management plans discussed with the patient, family and they are in agreement.  CODE STATUS: Full code  DVT Prophylaxis: SCDs  TOTAL CC TIME TAKING CARE OF THIS PATIENT: 35 minutes.   POSSIBLE D/C IN 2-3 DAYS, DEPENDING ON CLINICAL CONDITION.  Orie FishermanSrikar R Gresham Caetano M.D on 02/25/2017 at 10:03 AM  Between 7am to 6pm - Pager - [phone]  After 6pm go to [email or web address] - password [email or web address] [facility]  [facility] [place] [facility]  Office  [phone]  CC: Primary care physician; Judeen HammansSoles, [name], MD  Note: This dictation was prepared with Dragon dictation along with smaller phrase technology. Any transcriptional errors that result from this process are unintentional.

## 2017-02-25 NOTE — Care Management (Addendum)
Patient street address is [address] [place].  Says "I live with my girlfriend." Says he has no other family members - "they are all gone-dead and gone."  Says that [facility] but Dr Sallee LangeSoles is no longer practicing at that clinic.  Patient appears very feeble and having difficulty feeding himself. He does not have home oxygen and does not have a walker.  He is vague when answers questions regarding obtaining medications and regular follow up with physicians.  If patient is able to discharge home, he is agreeable to to having home health services and has no agency preference. Physical therapy consult is pending. Discussed the need to wean oxygen and or home oxygen assessment during progression. No agency preference for home health.  [facility] and SW

## 2017-02-25 NOTE — Progress Notes (Signed)
Pt BP was at 98/66 MD was notified and asked if he still want to give lasix and metropolol. MD okay to give meds.

## 2017-02-25 NOTE — Plan of Care (Addendum)
Nutrition Education Note  RD consulted for nutrition education regarding new onset CHF.  RD provided "Low Sodium Nutrition Therapy" handout from the Academy of Nutrition and Dietetics. Reviewed patient's dietary recall. Provided examples on ways to decrease sodium intake in diet. Discouraged intake of processed foods and use of salt shaker. Encouraged fresh fruits and vegetables as well as whole grain sources of carbohydrates to maximize fiber intake.   RD discussed why it is important for patient to adhere to diet recommendations, and emphasized the role of fluids, foods to avoid, and importance of weighing self daily. Teach back method used.  Expect fair compliance compliance.  Body mass index is 25.9 kg/m. Pt meets criteria for normal weight based on current BMI.  Current diet order is carb modified, patient is consuming approximately 100% of meals at this time. Labs and medications reviewed. No further nutrition interventions warranted at this time. RD contact information provided. If additional nutrition issues arise, please re-consult RD.   Dionne AnoWilliam M. Jamai Dolce, MS, RD LDN Inpatient Clinical Dietitian Pager [phone]

## 2017-02-25 NOTE — Progress Notes (Addendum)
Rounded on patient.  Patient admitted with dx of CHF - acute on chronic congestive heart failure - Echo on 02/22/2017 revealed EF less than 25%.  Patient also has Afib with RVR, acute hepatitis likely due to congestion from CHF, AKI with underlying CKD Stage III.    Patient lying in bed.  Significant other at bedside.    CHF Education:  Educational session with patient and significant other completed.  ? Provided patient with "Living Better with Heart Failure" packet. Briefly reviewed definition of heart failure and signs and symptoms of an exacerbation. Discussed the meaning of EF with patient.?  *Reviewed importance of and reason behind checking weight daily in the AM, after using the bathroom, but before getting dressed. Patient informed this RN that he has not been weighing himself at home.  Patient has scales.?Patient stated he does have scales at home he can use when he returns home.    Reviewed the following information with patient:  *Discussed when to call the Dr= weight gain of >3lb overnight of 5lb in a week,  *Discussed yellow zone= call MD: weight gain of >3lb overnight of 5lb in a week, increased swelling, increased SOB when lying down, chest discomfort, dizziness, increased fatigue. ?Patient was able to repeat this back to me.?? *Red Zone= call 911: struggle to breath, fainting or near fainting, significant chest pain. ?Patient stated that he would certainly call 911 in this situation.?? ? Patient stated he presented to the ER with worsening SOB and swelling in his feet and ankles.  ? *Reviewed low sodium diet-provided handout of recommended and not recommended foods. Reviewed reading labels with patient. Discussed fluid intake with patient as well. Patient not currently on a fluid restriction, but advised no more than 8-8 ounces glass of fluids per day.  Patient stated that he does add salt at times and he has the salt shaker on the table.  This RN instructed patient and significant  other to remove salt shaker from the table and instead of salt use lemon pepper, Mrs. Dash, and other seasonings.    *Instructed patient to take medications as prescribed for heart failure. Explained briefly why pt is on the medications (either make you feel better, live longer or keep you out of the hospital) and discussed monitoring and side effects.  ? *Discussed exercise. Encouraged patient to be as active as he possibly can given his SOB.   ? *Smoking Cessation - patient is a former smoker. Patient informed this RN that he quit smoking many years ago.   ? Patient informed this RN that primary cardiologist is Dr. Darrold JunkerParaschos.  Upon looking in "Care Everywhere" patient was a No Show for his initial consult to see Dr. Darrold JunkerParaschos in 2017.  Explained to patient and family the role of the [facility].  Explained the HF Clinic does not replace his cardiologist but gives him an additional resource for helping manage his heart failure.  Patient's appointment in the HF Clinic is scheduled for 03/05/2017 at 0820.  Directions to [facility] provided.    Patient and significant other thanked me for my time in reviewing this information with them.   ?  ? Army Meliaiane Dragovich, RN, BSN, [facility]? [facility]  [facility] Cardiac &?Pulmonary Rehab  Cardiovascular &?Pulmonary Nurse Navigator  Direct Line: [phone]  Department Phone #: [phone] Fax: [phone]? Email Address: [email or web address]

## 2017-02-25 NOTE — Progress Notes (Signed)
Pt has no complained duration of my shift. MD ordered Eliquis and PT consult for the pt.

## 2017-02-25 NOTE — Progress Notes (Signed)
PT entered the room and found the patient had called 911 to report that he was short of breath.  Patient is oriented to self and place only.  RR is 20 and O2 sats are 98%.  Respirations are unlabored.

## 2017-02-25 NOTE — Progress Notes (Signed)
Pt complains of feeling short of breath, oxygen sat on 2L is 100%, lungs show fine crackles to bases otherwise clear. I will continue to assess.

## 2017-02-25 NOTE — Evaluation (Signed)
Physical Therapy Evaluation Patient Details Name: Danny [name] MRN: [id number] DOB: 1950-08-05 Today's Date: 02/25/2017   History of Present Illness   Danny CanterburyRonnie Lee [name] is a 66 y.o. male has a past medical history significant for HTN, DM, COPD, and cardiomyopathy now with 1 week hx of progressive SOB and LE edema. Some cough. No fever. Denies CP or palpitations. In ER, pt was edematous with elevated BNP and troponin. CXR shows pulmonary edema and effusions c/w CHF. He is now admitted. Denies cardiac hx. On no meds.  Clinical Impression  Pt admitted with above diagnosis. Pt currently with functional limitations due to the deficits listed below (see PT Problem List).  Pt is very weak and confused. RN notified who paged MD. When PT arrived to the room pt had contacted 911 on the phone out of confusion. RN notified of this as well. Pt requiring maxA+1 for bed mobility and is unable to remain in sitting without constant support fromt herapist. Unable to attempt transfers or ambulation at this time. He will need SNF placement at discharge. Pt will benefit from PT services to address deficits in strength, balance, and mobility in order to return to full function at home.     Follow Up Recommendations SNF    Equipment Recommendations  Rolling walker with 5" wheels;Other (comment)(TBD further at next venue)    Recommendations for Other Services       Precautions / Restrictions Precautions Precautions: Fall Restrictions Weight Bearing Restrictions: No      Mobility  Bed Mobility Overal bed mobility: Needs Assistance Bed Mobility: Supine to Sit;Sit to Supine     Supine to sit: Max assist Sit to supine: Max assist   General bed mobility comments: Pt requires maxA+1 to come to sitting. Unsteady in sitting requiring modA+1 to remain upright. Pt continually falling backwards. Unable/unsafe to attempt ambulation at this time  Transfers                    Ambulation/Gait                 Stairs            Wheelchair Mobility    Modified Rankin (Stroke Patients Only)       Balance Overall balance assessment: Needs assistance Sitting-balance support: No upper extremity supported Sitting balance-Leahy Scale: Poor                                       Pertinent Vitals/Pain Pain Assessment: No/denies pain    Home Living Family/patient expects to be discharged to:: Private residence Living Arrangements: Spouse/significant other Available Help at Discharge: Family Type of Home: Apartment Home Access: Stairs to enter Entrance Stairs-Rails: Right Entrance Stairs-Number of Steps: 13 Home Layout: One level Home Equipment: None Additional Comments: no walker, cane, BSC, grab bars    Prior Function Level of Independence: Independent         Comments: Danny [name] reports that pt was independent with ADLs/IADLs prior to admission. 1 fall in 2018 which resulted in R hand fracture. Independent community ambulator without assistive device. Does not drive     Hand Dominance   Dominant Hand: Left    Extremity/Trunk Assessment   Upper Extremity Assessment Upper Extremity Assessment: Generalized weakness    Lower Extremity Assessment Lower Extremity Assessment: Generalized weakness       Communication   Communication: Other (comment)(Speech is slurred)  Cognition Arousal/Alertness: Lethargic Behavior During Therapy: Restless Overall Cognitive Status: Impaired/Different from baseline Area of Impairment: Orientation                 Orientation Level: Disoriented to;Place;Time;Situation             General Comments: Oriented to hospital but not to name of hospital. Disoriented to year, month, and situation      General Comments      Exercises     Assessment/Plan    PT Assessment Patient needs continued PT services  PT Problem List Decreased strength;Decreased activity tolerance;Decreased  balance;Decreased mobility;Decreased cognition       PT Treatment Interventions DME instruction;Gait training;Stair training;Functional mobility training;Therapeutic activities;Therapeutic exercise;Balance training;Neuromuscular re-education;Cognitive remediation;Patient/family education    PT Goals (Current goals can be found in the Care Plan section)  Acute Rehab PT Goals PT Goal Formulation: Patient unable to participate in goal setting Time For Goal Achievement: 03/11/17 Potential to Achieve Goals: Good    Frequency Min 2X/week   Barriers to discharge Inaccessible home environment 13 steps to enter second story apartment    Co-evaluation               AM-PAC PT "6 Clicks" Daily Activity  Outcome Measure Difficulty turning over in bed (including adjusting bedclothes, sheets and blankets)?: Unable Difficulty moving from lying on back to sitting on the side of the bed? : Unable Difficulty sitting down on and standing up from a chair with arms (e.g., wheelchair, bedside commode, etc,.)?: Unable Help needed moving to and from a bed to chair (including a wheelchair)?: Total Help needed walking in hospital room?: Total Help needed climbing 3-5 steps with a railing? : Total 6 Click Score: 6    End of Session Equipment Utilized During Treatment: Oxygen Activity Tolerance: Treatment limited secondary to medical complications (Comment);Other (comment)(LImited by confusion) Patient left: in bed;with bed alarm set Nurse Communication: Mobility status;Other (comment)(Confusion) PT Visit Diagnosis: Unsteadiness on feet (R26.81);Muscle weakness (generalized) (M62.81);Difficulty in walking, not elsewhere classified (R26.2)    Time: [id number] PT Time Calculation (min) (ACUTE ONLY): 23 min   Charges:   PT Evaluation $PT Eval Moderate Complexity: 1 Mod     PT G Codes:   PT G-Codes **NOT FOR INPATIENT CLASS** Functional Assessment Tool Used: AM-PAC 6 Clicks Basic  Mobility Functional Limitation: Mobility: Walking and moving around Mobility: Walking and Moving Around Current Status (Q4696(G8978): 100 percent impaired, limited or restricted Mobility: Walking and Moving Around Goal Status (E9528(G8979): 100 percent impaired, limited or restricted Mobility: Walking and Moving Around Discharge Status (U1324(G8980): 100 percent impaired, limited or restricted    Lynnea MaizesJason D Huprich PT, DPT    Huprich,Jason 02/25/2017, 5:55 PM

## 2017-02-26 LAB — COMPREHENSIVE METABOLIC PANEL WITH GFR
ALT: 318 U/L — ABNORMAL HIGH (ref 17–63)
AST: 69 U/L — ABNORMAL HIGH (ref 15–41)
Albumin: 3.4 g/dL — ABNORMAL LOW (ref 3.5–5.0)
Alkaline Phosphatase: 68 U/L (ref 38–126)
Anion gap: 10 (ref 5–15)
BUN: 28 mg/dL — ABNORMAL HIGH (ref 6–20)
CO2: 32 mmol/L (ref 22–32)
Calcium: 8.1 mg/dL — ABNORMAL LOW (ref 8.9–10.3)
Chloride: 96 mmol/L — ABNORMAL LOW (ref 101–111)
Creatinine, Ser: 1.06 mg/dL (ref 0.61–1.24)
GFR calc Af Amer: >60 mL/min
GFR calc non Af Amer: >60 mL/min
Glucose, Bld: 136 mg/dL — ABNORMAL HIGH (ref 65–99)
Potassium: 4 mmol/L (ref 3.5–5.1)
Sodium: 138 mmol/L (ref 135–145)
Total Bilirubin: 2.3 mg/dL — ABNORMAL HIGH (ref 0.3–1.2)
Total Protein: 6.2 g/dL — ABNORMAL LOW (ref 6.5–8.1)

## 2017-02-26 LAB — MAGNESIUM: MAGNESIUM: 1.9 mg/dL (ref 1.7–2.4)

## 2017-02-26 LAB — GLUCOSE, CAPILLARY
GLUCOSE-CAPILLARY: 110 mg/dL — AB (ref 65–99)
Glucose-Capillary: 113 mg/dL — ABNORMAL HIGH (ref 65–99)
Glucose-Capillary: 227 mg/dL — ABNORMAL HIGH (ref 65–99)
Glucose-Capillary: 112 mg/dL — ABNORMAL HIGH (ref 65–99)

## 2017-02-26 MED ORDER — HYDRALAZINE HCL 20 MG/ML IJ SOLN
10.0000 mg | INTRAMUSCULAR | Status: DC | PRN
  Administered 2017-02-26: 10 mg via INTRAVENOUS
  Filled 2017-02-26: qty 1

## 2017-02-26 NOTE — Progress Notes (Signed)
MD Allena KatzPatel made aware of 13 run of v-tach, no new orders received.

## 2017-02-26 NOTE — Progress Notes (Signed)
[facility] - [place] [facility]   PATIENT NAME: Danny [name]    MR#:  [id number]  DATE OF BIRTH:  05-05-1950  SUBJECTIVE:  CHIEF COMPLAINT:   Chief Complaint  Patient presents with  . Shortness of Breath    Shortness of breath is improving.  Lower extremity edema showing significant improvement. Confused at times  REVIEW OF SYSTEMS:    Review of Systems  Constitutional: Positive for malaise/fatigue. Negative for chills and fever.  HENT: Negative for sore throat.   Eyes: Negative for blurred vision, double vision and pain.  Respiratory: Positive for shortness of breath. Negative for cough, hemoptysis and wheezing.   Cardiovascular: Positive for orthopnea and leg swelling. Negative for chest pain and palpitations.  Gastrointestinal: Negative for abdominal pain, constipation, diarrhea, heartburn, nausea and vomiting.  Genitourinary: Negative for dysuria and hematuria.  Musculoskeletal: Negative for back pain and joint pain.  Skin: Negative for rash.  Neurological: Positive for weakness. Negative for sensory change, speech change, focal weakness and headaches.  Endo/Heme/Allergies: Does not bruise/bleed easily.  Psychiatric/Behavioral: Negative for depression. The patient is not nervous/anxious.     DRUG ALLERGIES:   Allergies  Allergen Reactions  . Penicillins     VITALS:  Blood pressure 100/68, pulse 86, temperature 98.4 F (36.9 C), temperature source Oral, resp. rate 20, height 5\' 10"  (1.778 m), weight 78.1 kg (172 lb 1.6 oz), SpO2 97 %.  PHYSICAL EXAMINATION:   Physical Exam  GENERAL:  66 y.o.-year-old patient lying in the bed with no acute distress.  EYES: Pupils equal, round, reactive to light and accommodation. No scleral icterus. Extraocular muscles intact.  HEENT: Head atraumatic, normocephalic. Oropharynx and nasopharynx clear.  NECK:  Supple, no jugular venous distention. No thyroid enlargement, no tenderness.  LUNGS: Normal breath  sounds bilaterally, no wheezing, rales, rhonchi. No use of accessory muscles of respiration.  CARDIOVASCULAR: S1, S2 normal. No murmurs, rubs, or gallops.  ABDOMEN: Soft, nontender, nondistended. Bowel sounds present. No organomegaly or mass.  EXTREMITIES: Right hand redness, numb. Bilateral lower extremity edema 1+ NEUROLOGIC: Cranial nerves II through XII are intact. No focal Motor or sensory deficits b/l.   PSYCHIATRIC: The patient is alert and oriented x 3.  SKIN: No obvious rash, lesion, or ulcer.   LABORATORY PANEL:   CBC Recent Labs  Lab 02/25/17 0328  WBC 6.9  HGB 12.6*  HCT 39.6*  PLT 104*   ------------------------------------------------------------------------------------------------------------------ Chemistries  Recent Labs  Lab 02/26/17 0316  NA 138  K 4.0  CL 96*  CO2 32  GLUCOSE 136*  BUN 28*  CREATININE 1.06  CALCIUM 8.1*  MG 1.9  AST 69*  ALT 318*  ALKPHOS 68  BILITOT 2.3*   ------------------------------------------------------------------------------------------------------------------  Cardiac Enzymes Recent Labs  Lab 02/22/17 0449  TROPONINI 0.11*   ------------------------------------------------------------------------------------------------------------------  RADIOLOGY:  No results found.   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  Will switch to orals tomorrow.  Good diuresis. On metoprolol. Entresto not added due to hypotension.  *Acute hepatitis likely due to congestion from CHF. Ultrasound of the abdomen showed ascites. GI on board.  Improving. MOnitor  *Acute kidney injury over CKD stage III likely due to cardiorenal syndrome.  Resolved  *Mild elevation in troponin due to CHF.  Flat trend.    *Hypokalemia and hypomagnesemia.  Replace while being  diuresed.  *  DVT prophylaxis with Lovenox.  All the records are reviewed and case discussed with Care Management/Social Worker. Management plans discussed with the patient, family and they are in agreement.  CODE STATUS: Full code  DVT Prophylaxis: SCDs  TOTAL CC TIME TAKING CARE OF THIS PATIENT: 35 minutes.   POSSIBLE D/C IN 2-3 DAYS, DEPENDING ON CLINICAL CONDITION.  Orie FishermanSrikar R Carman Auxier M.D on 02/26/2017 at 12:19 PM  Between 7am to 6pm - Pager - [phone]  After 6pm go to [email or web address] - password [email or web address] [facility]  [facility] [place] [facility]  Office  [phone]  CC: Primary care physician; Judeen HammansSoles, [name], MD  Note: This dictation was prepared with Dragon dictation along with smaller phrase technology. Any transcriptional errors that result from this process are unintentional.

## 2017-02-27 LAB — GLUCOSE, CAPILLARY
GLUCOSE-CAPILLARY: 126 mg/dL — AB (ref 65–99)
Glucose-Capillary: 111 mg/dL — ABNORMAL HIGH (ref 65–99)

## 2017-02-27 MED ORDER — MAGNESIUM OXIDE 400 (241.3 MG) MG PO TABS: 400.0000 mg | ORAL_TABLET | Freq: Every day | ORAL | Status: AC

## 2017-02-27 MED ORDER — METOPROLOL TARTRATE 25 MG PO TABS: 12.5000 mg | ORAL_TABLET | Freq: Two times a day (BID) | ORAL | Status: AC

## 2017-02-27 MED ORDER — DIGOXIN 125 MCG PO TABS: 0.1250 mg | ORAL_TABLET | Freq: Every day | ORAL | 0 refills | Status: AC

## 2017-02-27 MED ORDER — FUROSEMIDE 40 MG PO TABS: 40.0000 mg | ORAL_TABLET | Freq: Two times a day (BID) | ORAL | 11 refills | Status: AC

## 2017-02-27 MED ORDER — IPRATROPIUM-ALBUTEROL 0.5-2.5 (3) MG/3ML IN SOLN: 3.0000 mL | Freq: Four times a day (QID) | RESPIRATORY_TRACT | Status: AC | PRN

## 2017-02-27 MED ORDER — APIXABAN 5 MG PO TABS: 5.0000 mg | ORAL_TABLET | Freq: Two times a day (BID) | ORAL | 0 refills | Status: AC

## 2017-02-27 NOTE — Care Management (Signed)
[facility] notified that patient discharging to skilled nursing.

## 2017-02-27 NOTE — Progress Notes (Signed)
Pt will be discharged to [facility]. Report called to [facility]. EMS called for transport. I will continue to assess.

## 2017-02-27 NOTE — [place] FL2 (Signed)
[place] MEDICAID FL2 LEVEL OF CARE SCREENING TOOL     IDENTIFICATION  Patient Name: Danny CanterburyRonnie Lee [name] Birthdate: July 29, 1950 Sex: male Admission Date (Current Location): 02/21/2017  [facility] [place]   Facility and Address:  [facility], [address], [place], [place] [address]      Provider Number: [id number]  Attending Physician Name and Address:  Milagros LollSudini, Srikar, MD  Relative Name and Phone Number:       Current Level of Care: Hospital Recommended Level of Care: Skilled Nursing Facility Prior Approval Number:    Date Approved/Denied:   PASRR Number:    Discharge Plan: SNF    Current Diagnoses: Patient Active Problem List   Diagnosis Date Noted  . Abnormal LFTs   . DNR (do not resuscitate)   . Palliative care by specialist   . CHF (congestive heart failure) (HCC) 02/21/2017  . Diabetes (HCC) 10/17/2014  . Hypertension 09/06/2014  . Cardiomyopathy (HCC) 09/06/2014  . COPD (chronic obstructive pulmonary disease) (HCC) 02/02/2013    Orientation RESPIRATION BLADDER Height & Weight     Self, Place  Normal Incontinent Weight: 174 lb 1.6 oz (79 kg) Height:  5\' 10"  (177.8 cm)  BEHAVIORAL SYMPTOMS/MOOD NEUROLOGICAL BOWEL NUTRITION STATUS  (none) (none) Incontinent Diet(2 gm na)  AMBULATORY STATUS COMMUNICATION OF NEEDS Skin   Extensive Assist Verbally Normal                       Personal Care Assistance Level of Assistance  Dressing, Bathing Bathing Assistance: Maximum assistance   Dressing Assistance: Maximum assistance     Functional Limitations Info  Sight, Hearing Sight Info: Impaired Hearing Info: Impaired      SPECIAL CARE FACTORS FREQUENCY  PT (By licensed PT)                    Contractures Contractures Info: Not present    Additional Factors Info  Code Status, Allergies Code Status Info: dnr Allergies Info: pcn's           Current Medications (02/27/2017):  This is the  current hospital active medication list Current Facility-Administered Medications  Medication Dose Route Frequency Provider Last Rate Last Dose  . 0.9 %  sodium chloride infusion  250 mL Intravenous PRN Marguarite ArbourSparks, Jeffrey D, MD      . acetaminophen (TYLENOL) tablet 650 mg  650 mg Oral Q6H PRN Marguarite ArbourSparks, Jeffrey D, MD       Or  . acetaminophen (TYLENOL) suppository 650 mg  650 mg Rectal Q6H PRN Marguarite ArbourSparks, Jeffrey D, MD      . apixaban Everlene Balls(ELIQUIS) tablet 5 mg  5 mg Oral BID Milagros LollSudini, Srikar, MD   5 mg at 02/26/17 2135  . bisacodyl (DULCOLAX) suppository 10 mg  10 mg Rectal Daily PRN Marguarite ArbourSparks, Jeffrey D, MD      . digoxin Margit Banda(LANOXIN) tablet 0.125 mg  0.125 mg Oral Daily Marguarite ArbourSparks, Jeffrey D, MD   0.125 mg at 02/26/17 0953  . diphenhydrAMINE (BENADRYL) capsule 25 mg  25 mg Oral QHS PRN Oralia ManisWillis, David, MD   25 mg at 02/26/17 2329  . docusate sodium (COLACE) capsule 100 mg  100 mg Oral BID Marguarite ArbourSparks, Jeffrey D, MD   100 mg at 02/26/17 2135  . furosemide (LASIX) injection 40 mg  40 mg Intravenous BID Milagros LollSudini, Srikar, MD   40 mg at 02/26/17 1701  . hydrALAZINE (APRESOLINE) injection 10 mg  10 mg Intravenous Q4H PRN Ihor AustinPyreddy, Pavan, MD  10 mg at 02/26/17 0444  . Influenza vac split quadrivalent PF (FLUZONE HIGH-DOSE) injection 0.5 mL  0.5 mL Intramuscular Tomorrow-1000 Aram BeechamSparks, Jeffrey D, MD      . insulin aspart (novoLOG) injection 0-9 Units  0-9 Units Subcutaneous TID WC Marguarite ArbourSparks, Jeffrey D, MD   3 Units at 02/26/17 1701  . ipratropium-albuterol (DUONEB) 0.5-2.5 (3) MG/3ML nebulizer solution 3 mL  3 mL Nebulization TID Milagros LollSudini, Srikar, MD   3 mL at 02/26/17 2102  . magnesium oxide (MAG-OX) tablet 400 mg  400 mg Oral Daily Milagros LollSudini, Srikar, MD   400 mg at 02/26/17 0950  . MEDLINE mouth rinse  15 mL Mouth Rinse BID Marguarite ArbourSparks, Jeffrey D, MD   15 mL at 02/26/17 2147  . metoprolol tartrate (LOPRESSOR) tablet 12.5 mg  12.5 mg Oral BID Milagros LollSudini, Srikar, MD   12.5 mg at 02/26/17 2135  . mometasone-formoterol (DULERA) 200-5 MCG/ACT inhaler 2 puff   2 puff Inhalation BID Marguarite ArbourSparks, Jeffrey D, MD   2 puff at 02/26/17 2138  . ondansetron (ZOFRAN) tablet 4 mg  4 mg Oral Q6H PRN Marguarite ArbourSparks, Jeffrey D, MD       Or  . ondansetron (ZOFRAN) injection 4 mg  4 mg Intravenous Q6H PRN Marguarite ArbourSparks, Jeffrey D, MD      . pantoprazole (PROTONIX) injection 40 mg  40 mg Intravenous Q12H Marguarite ArbourSparks, Jeffrey D, MD   40 mg at 02/26/17 2135  . pneumococcal 23 valent vaccine (PNU-IMMUNE) injection 0.5 mL  0.5 mL Intramuscular Tomorrow-1000 Aram BeechamSparks, Jeffrey D, MD      . sodium chloride flush (NS) 0.9 % injection 3 mL  3 mL Intravenous Q12H Marguarite ArbourSparks, Jeffrey D, MD   3 mL at 02/26/17 2135  . sodium chloride flush (NS) 0.9 % injection 3 mL  3 mL Intravenous PRN Marguarite ArbourSparks, Jeffrey D, MD   3 mL at 02/22/17 2146     Discharge Medications: Please see discharge summary for a list of discharge medications.  Relevant Imaging Results:  Relevant Lab Results:   Additional Information ss: [id number]  [place] SpanielMonica Keyandra Swenson, LCSW

## 2017-02-27 NOTE — Care Management Important Message (Signed)
Important Message  Patient Details  Name: Danny CanterburyRonnie Lee Delrossi MRN: [id number] Date of Birth: 02-25-1951   Medicare Important Message Given:  Yes Signed IM notice given   Eber HongGreene, Jaquanda Wickersham R, RN 02/27/2017, 11:03 AM

## 2017-02-27 NOTE — Clinical Social Work Placement (Signed)
   CLINICAL SOCIAL WORK PLACEMENT  NOTE  Date:  02/27/2017  Patient Details  Name: Danny [name] MRN: [id number] Date of Birth: [birth year]  Clinical Social Work is seeking post-discharge placement for this patient at the Skilled  Nursing Facility level of care (*CSW will initial, date and re-position this form in  chart as items are completed):  Yes   Patient/family provided with [facility] Clinical Social Work Department's list of facilities offering this level of care within the geographic area requested by the patient (or if unable, by the patient's family).  Yes   Patient/family informed of their freedom to choose among providers that offer the needed level of care, that participate in Medicare, Medicaid or managed care program needed by the patient, have an available bed and are willing to accept the patient.  Yes   Patient/family informed of [facility]'s ownership interest in [facility] and [facility], as well as of the fact that they are under no obligation to receive care at these facilities.  PASRR submitted to EDS on       PASRR number received on       Existing PASRR number confirmed on 02/27/17     FL2 transmitted to all facilities in geographic area requested by pt/family on 02/27/17     FL2 transmitted to all facilities within larger geographic area on       Patient informed that his/her managed care company has contracts with or will negotiate with certain facilities, including the following:        Yes   Patient/family informed of bed offers received.  Patient [facility]     Physician recommends and patient chooses bed at [facility](SNF)    Patient to be transferred to [facility]) on 02/27/17.  Patient to be transferred to facility by (EMS)     Patient family notified on 02/27/17 of transfer.  Name of family member notified:  (girlfriend)     PHYSICIAN       Additional Comment:     _______________________________________________ [place] SpanielMonica Calin Ellery, LCSW 02/27/2017, 11:07 AM

## 2017-02-27 NOTE — Clinical Social Work Note (Signed)
Clinical Social Work Assessment  Patient Details  Name: Danny CanterburyRonnie Lee [name] MRN: [id number] Date of Birth: Apr 02, 1951  Date of referral:  02/27/17               Reason for consult:  Discharge Planning                Permission sought to share information with:  Facility Medical sales representativeContact Representative, Family Supports Permission granted to share information::  Yes, Verbal Permission Granted  Name::        Agency::     Relationship::     Contact Information:     Housing/Transportation Living arrangements for the past 2 months:  Single Family Home Source of Information:  Patient, Spouse Patient Interpreter Needed:  None Criminal Activity/Legal Involvement Pertinent to Current Situation/Hospitalization:  No - Comment as needed Significant Relationships:  Spouse Lives with:  Self Do you feel safe going back to the place where you live?  Yes Need for family participation in patient care:  Yes (Comment)  Care giving concerns:  Patient resides with his girlfriend.   Social Worker assessment / plan:  CSW spoke with patient and his girlfriend and they are in agreement with rehab and [facility]. MD is discharging patient today. Nurse to call report and EMS to transport. Discharge information sent and [facility] has been notified of discharge.   Employment status:  Disabled (Comment on whether or not currently receiving Disability) Insurance information:  Medicare, [place] Recommendations:  Skilled Nursing Facility Information / Referral to community resources:     Patient/Family's Response to care:  Patient expressed appreciation for CSW assistance.  Patient/Family's Understanding of and Emotional Response to Diagnosis, Current Treatment, and Prognosis:  Patient is somewhat anxious at baseline but is aware he is ready for discharge.  Emotional Assessment Appearance:  Appears stated age Attitude/Demeanor/Rapport:  (pleasant and  cooperative) Affect (typically observed):  Accepting, Adaptable Orientation:  Oriented to Self, Oriented to Place, Oriented to  Time, Oriented to Situation Alcohol / Substance use:  Not Applicable Psych involvement (Current and /or in the community):  No (Comment)  Discharge Needs  Concerns to be addressed:  Care Coordination Readmission within the last 30 days:  No Current discharge risk:  None Barriers to Discharge:  No Barriers Identified   [place] SpanielMonica Kelena Garrow, LCSW 02/27/2017, 11:03 AM

## 2017-02-27 NOTE — Discharge Summary (Signed)
[facility] [place] [facility]   PATIENT NAME: Danny [name]    MR#:  [id number]  DATE OF BIRTH:  February 11, 1951  DATE OF ADMISSION:  02/21/2017 ADMITTING PHYSICIAN: Marguarite ArbourJeffrey D Sparks, MD  DATE OF DISCHARGE: 02/27/2017  PRIMARY CARE PHYSICIAN: Soles, Willaim RayasMeredith Key, MD   ADMISSION DIAGNOSIS:  SOB (shortness of breath) [R06.02] Abnormal LFTs [R94.5] Dyspnea, unspecified type [R06.00] Acute on chronic congestive heart failure, unspecified heart failure type (HCC) [I50.9]  DISCHARGE DIAGNOSIS:  Principal Problem:   CHF (congestive heart failure) (HCC) Active Problems:   Diabetes (HCC)   Cardiomyopathy (HCC)   COPD (chronic obstructive pulmonary disease) (HCC)   Abnormal LFTs   DNR (do not resuscitate)   Palliative care by specialist   SECONDARY DIAGNOSIS:   Past Medical History:  Diagnosis Date  . Cardiomyopathy (HCC)   . COPD (chronic obstructive pulmonary disease) (HCC)   . Hypertension      ADMITTING HISTORY  HPI: Danny [name] is a 66 y.o. male has a past medical history significant for HTN, DM, COPD, and cardiomyopathy now with 1 week hx of progressive SOB and LE edema. Some cough. No fever. Denies CP or palpitations. In ER, pt was edematous with elevated BNP and troponin. CXR shows pulmonary edema and effusions c/w CHF. He is now admitted. Denies cardiac hx. On no meds.    HOSPITAL COURSE:   * Afib with RVR Resolved HR in 80s Continue digoxin and metoprolol Added Eliquis.  *Acute on chronic systolic congestive heart failure - 25% with dialted cardiomyopathy Diuresed well Lasix 40 BID after discharge Entresto not added due to hypotension. F/U with Dr. Juliann Paresallwood in office within 1 week  *Acute hepatitis likely due to congestion from CHF. Ultrasound of the abdomen showed some ascites. GI on board.  Improving.  F/U with Dr. Servando SnareWohl  *Acute kidney injury over CKD stage III likely due to cardiorenal syndrome. Resolved  *Mild  elevation in troponin due to CHF.  Flat trend.  *Hypokalemia and hypomagnesemia.  Replaced while being diuresed. Mg Ox after discharge  * Mild inpatient delirium is improving  Stable for discharge to SNF for PT    CONSULTS OBTAINED:  Treatment Team:  Alwyn Peaallwood, Dwayne D, MD  DRUG ALLERGIES:   Allergies  Allergen Reactions  . Penicillins     DISCHARGE MEDICATIONS:   Current Discharge Medication List    START taking these medications   Details  apixaban (ELIQUIS) 5 MG TABS tablet Take 1 tablet (5 mg total) by mouth 2 (two) times daily. Qty: 60 tablet, Refills: 0    furosemide (LASIX) 40 MG tablet Take 1 tablet (40 mg total) by mouth 2 (two) times daily. Qty: 30 tablet, Refills: 11    ipratropium-albuterol (DUONEB) 0.5-2.5 (3) MG/3ML SOLN Take 3 mLs by nebulization every 6 (six) hours as needed. Qty: 360 mL    magnesium oxide (MAG-OX) 400 (241.3 Mg) MG tablet Take 1 tablet (400 mg total) by mouth daily.    metoprolol tartrate (LOPRESSOR) 25 MG tablet Take 0.5 tablets (12.5 mg total) by mouth 2 (two) times daily.      CONTINUE these medications which have CHANGED   Details  digoxin (LANOXIN) 0.125 MG tablet Take 1 tablet (0.125 mg total) by mouth daily. Qty: 30 tablet, Refills: 0      CONTINUE these medications which have NOT CHANGED   Details  aspirin EC 81 MG tablet Take 81 mg daily by mouth.    Tiotropium Bromide-Olodaterol (STIOLTO RESPIMAT) 2.5-2.5 MCG/ACT AERS Inhale  1 puff daily into the lungs.    silver sulfADIAZINE (SILVADENE) 1 % cream Apply to affected area daily Qty: 50 g, Refills: 1        Today   VITAL SIGNS:  Blood pressure 132/76, pulse (!) 104, temperature 97.7 F (36.5 C), resp. rate 16, height 5\' 10"  (1.778 m), weight 79 kg (174 lb 1.6 oz), SpO2 99 %.  I/O:    Intake/Output Summary (Last 24 hours) at 02/27/2017 1003 Last data filed at 02/26/2017 1814 Gross per 24 hour  Intake 480 ml  Output 1325 ml  Net -845 ml    PHYSICAL  EXAMINATION:  Physical Exam  GENERAL:  66 y.o.-year-old patient lying in the bed with no acute distress.  LUNGS: Normal breath sounds bilaterally, no wheezing, rales,rhonchi or crepitation. No use of accessory muscles of respiration.  CARDIOVASCULAR: S1, S2 normal. No murmurs, rubs, or gallops.  ABDOMEN: Soft, non-tender, non-distended. Bowel sounds present. No organomegaly or mass.  NEUROLOGIC: Moves all 4 extremities. PSYCHIATRIC: The patient is alert and awake SKIN: Excoriation lower extremities  DATA REVIEW:   CBC Recent Labs  Lab 02/25/17 0328  WBC 6.9  HGB 12.6*  HCT 39.6*  PLT 104*    Chemistries  Recent Labs  Lab 02/26/17 0316  NA 138  K 4.0  CL 96*  CO2 32  GLUCOSE 136*  BUN 28*  CREATININE 1.06  CALCIUM 8.1*  MG 1.9  AST 69*  ALT 318*  ALKPHOS 68  BILITOT 2.3*    Cardiac Enzymes Recent Labs  Lab 02/22/17 0449  TROPONINI 0.11*    Microbiology Results  No results found for this or any previous visit.  RADIOLOGY:  No results found.  Follow up with PCP in 1 week.  Management plans discussed with the patient, family and they are in agreement.  CODE STATUS:     Code Status Orders  (From admission, onward)        Start     Ordered   02/23/17 1341  Do not attempt resuscitation (DNR)  Continuous    Question Answer Comment  In the event of cardiac or respiratory ARREST Do not call a "code blue"   In the event of cardiac or respiratory ARREST Do not perform Intubation, CPR, defibrillation or ACLS   In the event of cardiac or respiratory ARREST Use medication by any route, position, wound care, and other measures to relive pain and suffering. May use oxygen, suction and manual treatment of airway obstruction as needed for comfort.      02/23/17 1340    Code Status History    Date Active Date Inactive Code Status Order ID Comments User Context   02/21/2017 17:21 02/23/2017 13:40 Full Code [id number]  Marguarite ArbourSparks, Jeffrey D, MD Inpatient       TOTAL TIME TAKING CARE OF THIS PATIENT ON DAY OF DISCHARGE: more than 30 minutes.   Orie FishermanSrikar R Dariann Huckaba M.D on 02/27/2017 at 10:03 AM  Between 7am to 6pm - Pager - [phone]  After 6pm go to [email or web address] - password [email or web address] [facility]  [facility] [place] [facility]  Office  [phone]  CC: Primary care physician; Judeen HammansSoles, [name], MD  Note: This dictation was prepared with Dragon dictation along with smaller phrase technology. Any transcriptional errors that result from this process are unintentional.

## 2017-03-04 ENCOUNTER — Other Ambulatory Visit: Payer: Self-pay

## 2017-03-04 ENCOUNTER — Ambulatory Visit: Payer: Medicare Other | Attending: Family | Admitting: Family

## 2017-03-04 ENCOUNTER — Encounter: Payer: Self-pay | Admitting: Family

## 2017-03-04 VITALS — BP 143/92 | HR 79 | Resp 18 | Ht 70.0 in | Wt 225.0 lb

## 2017-03-04 DIAGNOSIS — Z7982 Long term (current) use of aspirin: Secondary | ICD-10-CM | POA: Insufficient documentation

## 2017-03-04 DIAGNOSIS — J449 Chronic obstructive pulmonary disease, unspecified: Secondary | ICD-10-CM | POA: Diagnosis not present

## 2017-03-04 DIAGNOSIS — Z87891 Personal history of nicotine dependence: Secondary | ICD-10-CM | POA: Insufficient documentation

## 2017-03-04 DIAGNOSIS — I10 Essential (primary) hypertension: Secondary | ICD-10-CM

## 2017-03-04 DIAGNOSIS — I110 Hypertensive heart disease with heart failure: Secondary | ICD-10-CM | POA: Insufficient documentation

## 2017-03-04 DIAGNOSIS — F329 Major depressive disorder, single episode, unspecified: Secondary | ICD-10-CM | POA: Diagnosis not present

## 2017-03-04 DIAGNOSIS — R0602 Shortness of breath: Secondary | ICD-10-CM | POA: Diagnosis present

## 2017-03-04 DIAGNOSIS — I5022 Chronic systolic (congestive) heart failure: Secondary | ICD-10-CM | POA: Diagnosis not present

## 2017-03-04 DIAGNOSIS — I429 Cardiomyopathy, unspecified: Secondary | ICD-10-CM | POA: Insufficient documentation

## 2017-03-04 DIAGNOSIS — I4891 Unspecified atrial fibrillation: Secondary | ICD-10-CM | POA: Diagnosis not present

## 2017-03-04 DIAGNOSIS — Z7901 Long term (current) use of anticoagulants: Secondary | ICD-10-CM | POA: Insufficient documentation

## 2017-03-04 DIAGNOSIS — S81801A Unspecified open wound, right lower leg, initial encounter: Secondary | ICD-10-CM | POA: Diagnosis not present

## 2017-03-04 DIAGNOSIS — Z880 Allergy status to penicillin: Secondary | ICD-10-CM | POA: Insufficient documentation

## 2017-03-04 DIAGNOSIS — Z79899 Other long term (current) drug therapy: Secondary | ICD-10-CM | POA: Diagnosis not present

## 2017-03-04 DIAGNOSIS — X58XXXA Exposure to other specified factors, initial encounter: Secondary | ICD-10-CM | POA: Insufficient documentation

## 2017-03-04 DIAGNOSIS — I480 Paroxysmal atrial fibrillation: Secondary | ICD-10-CM

## 2017-03-04 NOTE — Progress Notes (Signed)
Patient ID: Danny CanterburyRonnie Lee [name], male    DOB: 1950-07-01, 66 y.o.   MRN: [id number]  HPI  Mr Danny [name] is a 66 y/o male with a history of COPD, HTN, atrial fibrillation, previous tobacco use and chronic heart failure.   Echo report from 02/22/17 reviewed and showed an EF of 25% with severely reduced RV function.   Admitted 02/21/17 due to HF and afib with RVR. Medications were adjusted. Cardiology and GI consults were obtained. Discharged to SNF for physical therapy after 4 days.   He presents today for his initial visit with a chief complaint of moderate shortness of breath with little exertion. He describes this as chronic in nature having been present for several years with varying levels of severity. He has associated fatigue, cough, edema, depression and difficulty sleeping along with this. He denies any chest pain or dizziness. Doesn't think he's getting weighed daily.   Past Medical History:  Diagnosis Date  . Cardiomyopathy (HCC)   . CHF (congestive heart failure) (HCC)   . COPD (chronic obstructive pulmonary disease) (HCC)   . Hypertension    Past Surgical History:  Procedure Laterality Date  . HERNIA REPAIR     x 3  . WRIST FRACTURE SURGERY Right 05/2016   History reviewed. No pertinent family history. Social History   Tobacco Use  . Smoking status: Former Smoker    Types: Cigarettes    Last attempt to quit: 10/05/2004    Years since quitting: 12.4  . Smokeless tobacco: Never Used  Substance Use Topics  . Alcohol use: No   Allergies  Allergen Reactions  . Penicillins    Prior to Admission medications   Medication Sig Start Date End Date Taking? Authorizing Provider  apixaban (ELIQUIS) 5 MG TABS tablet Take 1 tablet (5 mg total) by mouth 2 (two) times daily. 02/27/17  Yes Milagros LollSudini, Srikar, MD  aspirin EC 81 MG tablet Take 81 mg daily by mouth.   Yes [provider]  digoxin (LANOXIN) 0.125 MG tablet Take 1 tablet (0.125 mg total) by mouth daily. 02/27/17  Yes  Sudini, Wardell HeathSrikar, MD  furosemide (LASIX) 40 MG tablet Take 1 tablet (40 mg total) by mouth 2 (two) times daily. 02/27/17 02/27/18 Yes Sudini, Wardell HeathSrikar, MD  ipratropium-albuterol (DUONEB) 0.5-2.5 (3) MG/3ML SOLN Take 3 mLs by nebulization every 6 (six) hours as needed. 02/27/17  Yes Sudini, Wardell HeathSrikar, MD  magnesium oxide (MAG-OX) 400 (241.3 Mg) MG tablet Take 1 tablet (400 mg total) by mouth daily. 02/28/17  Yes Milagros LollSudini, Srikar, MD  metoprolol tartrate (LOPRESSOR) 25 MG tablet Take 0.5 tablets (12.5 mg total) by mouth 2 (two) times daily. 02/27/17  Yes Milagros LollSudini, Srikar, MD  silver sulfADIAZINE (SILVADENE) 1 % cream Apply to affected area daily 07/10/16  Yes Emily FilbertWilliams, Jonathan E, MD  Tiotropium Bromide-Olodaterol (STIOLTO RESPIMAT) 2.5-2.5 MCG/ACT AERS Inhale 1 puff daily into the lungs.   Yes [provider]   Review of Systems  Constitutional: Positive for fatigue. Negative for appetite change.  HENT: Negative for congestion, postnasal drip and sore throat.   Eyes: Negative.   Respiratory: Positive for cough and shortness of breath. Negative for chest tightness.   Cardiovascular: Positive for leg swelling. Negative for chest pain and palpitations.  Gastrointestinal: Negative for abdominal distention and abdominal pain.  Endocrine: Negative.   Genitourinary: Negative.   Musculoskeletal: Positive for back pain. Negative for neck pain.  Skin: Positive for wound (right lower leg).  Allergic/Immunologic: Negative.   Neurological: Negative for dizziness and light-headedness.  Hematological: Negative for adenopathy. Bruises/bleeds easily.  Psychiatric/Behavioral: Positive for dysphoric mood and sleep disturbance (not sleeping well). The patient is not nervous/anxious.    Vitals:   03/04/17 0845  BP: (!) 143/92  Pulse: 79  Resp: 18  SpO2: 94%  Weight: 225 lb (102.1 kg)  Height: 5\' 10"  (1.778 m)   Wt Readings from Last 3 Encounters:  03/04/17 225 lb (102.1 kg)  02/27/17 174 lb 1.6 oz (79 kg)   07/10/16 196 lb (88.9 kg)   Lab Results  Component Value Date   CREATININE 1.06 02/26/2017   CREATININE 0.92 02/25/2017   CREATININE 1.18 02/24/2017   Physical Exam  Constitutional: He is oriented to person, place, and time. He appears well-developed and well-nourished.  HENT:  Head: Normocephalic and atraumatic.  Neck: Normal range of motion. Neck supple. No JVD present.  Cardiovascular: Normal rate. An irregular rhythm present.  Pulmonary/Chest: Effort normal. He has no wheezes. He has no rales.  Abdominal: Soft. He exhibits no distension. There is no tenderness.  Musculoskeletal: He exhibits edema (2+ edema in bilateral lower legs). He exhibits no tenderness.  Neurological: He is alert and oriented to person, place, and time.  Skin: Skin is warm. Lesion (right anterior shin) noted. No erythema.  Psychiatric: He has a normal mood and affect. His behavior is normal.  Nursing note and vitals reviewed.   Assessment & Plan:  1: Chronic heart failure with reduced ejection fraction- - NYHA class III - fluid overloaded with edema - not being weighed daily so an order was written for him to be weighed daily and to call for an overnight weight gain of >2 pounds or a weekly weight gain of >5 pounds - hasn't been elevating his legs and an order was written for him to elevate his legs during the day when sitting for long periods of time - says that he's not adding adding salt and says that some foods at the facility taste salty to him - will begin entresto 24/26mg  twice daily - will check a BMP at his next visit  2: HTN- - BP mildly elevated today - adding entresto per above - BMP from 02/26/17 reviewed and showed sodium 138, potassium 4.0 and GFR >60  3: Atrial fibrillation- - on apixaban, digoxin and lopressor - plan to change BID metoprolol to once daily metoprolol succinate in the future  4: Right lower leg wound- - has an order for silvadene but unsure if this area is where  it is being applied to - order written for PCP at the facility [facility]) to evaluate wound  Facility medication list was reviewed.  Patient mentions that the nurses are "mean". When asked to elaborate, he says that they are rough with him and will take a piece of paper, roll it up and hit him in the head to "keep him in line". No bruises noted on head or back. Discussed reporting his concerns to social services and he asks us to not do that as he doesn't want to make things worse and then says that not all the nurses are like this. Offered to report it again and he asks us to not say anything. Encouraged him to speak with the social worker at the facility as he says that he'd [place]. Patient has no family.   Return in 3 weeks or sooner for any questions/problems before then.

## 2017-03-04 NOTE — Patient Instructions (Signed)
Begin weighing daily and call for an overnight weight gain of > 2 pounds or a weekly weight gain of >5 pounds. 

## 2017-03-05 ENCOUNTER — Ambulatory Visit: Payer: Medicare Other | Admitting: Family

## 2017-03-05 ENCOUNTER — Encounter: Payer: Self-pay | Admitting: Family

## 2017-03-05 DIAGNOSIS — S81801A Unspecified open wound, right lower leg, initial encounter: Secondary | ICD-10-CM | POA: Insufficient documentation

## 2017-03-05 DIAGNOSIS — I4891 Unspecified atrial fibrillation: Secondary | ICD-10-CM | POA: Insufficient documentation

## 2017-03-10 ENCOUNTER — Encounter: Payer: Self-pay | Admitting: Gastroenterology

## 2017-03-10 ENCOUNTER — Ambulatory Visit (INDEPENDENT_AMBULATORY_CARE_PROVIDER_SITE_OTHER): Payer: Medicare Other | Admitting: Gastroenterology

## 2017-03-10 VITALS — BP 92/57 | HR 48 | Ht 70.0 in | Wt 225.0 lb

## 2017-03-10 DIAGNOSIS — R748 Abnormal levels of other serum enzymes: Secondary | ICD-10-CM

## 2017-03-10 NOTE — Patient Instructions (Addendum)
We have ordered a lab to check your liver enzymes. [facility] to [facility]. They will direct you to the outpatient lab to have these done. Orders are in the system.

## 2017-03-10 NOTE — Progress Notes (Signed)
Primary Care Physician: Judeen HammansSoles, [name], MD  Primary Gastroenterologist:  Dr. [name] Jackie Russman  Chief Complaint  Patient presents with  . Hospitalization Follow-up    HPI: Danny [name] is a 66 y.o. male here for follow-up after hospital discharge. The patient reports that he has been doing well since discharge.  The patient was in the hospital for congestive heart failure and was found to have abnormal liver enzymes.  The patient's liver enzymes had slowly gone down during his hospital stay and were likely due to hepatic congestion and shock liver.  Current Outpatient Medications  Medication Sig Dispense Refill  . apixaban (ELIQUIS) 5 MG TABS tablet Take 1 tablet (5 mg total) by mouth 2 (two) times daily. 60 tablet 0  . aspirin EC 81 MG tablet Take 81 mg daily by mouth.    . digoxin (LANOXIN) 0.125 MG tablet Take 1 tablet (0.125 mg total) by mouth daily. 30 tablet 0  . furosemide (LASIX) 40 MG tablet Take 1 tablet (40 mg total) by mouth 2 (two) times daily. 30 tablet 11  . ipratropium-albuterol (DUONEB) 0.5-2.5 (3) MG/3ML SOLN Take 3 mLs by nebulization every 6 (six) hours as needed. 360 mL   . magnesium oxide (MAG-OX) 400 (241.3 Mg) MG tablet Take 1 tablet (400 mg total) by mouth daily.    . metoprolol tartrate (LOPRESSOR) 25 MG tablet Take 0.5 tablets (12.5 mg total) by mouth 2 (two) times daily.    . sacubitril-valsartan (ENTRESTO) 24-26 MG Take 1 tablet by mouth 2 (two) times daily.    . silver sulfADIAZINE (SILVADENE) 1 % cream Apply to affected area daily 50 g 1  . Tiotropium Bromide-Olodaterol (STIOLTO RESPIMAT) 2.5-2.5 MCG/ACT AERS Inhale 1 puff daily into the lungs.     No current facility-administered medications for this visit.     Allergies as of 03/10/2017 - Review Complete 03/10/2017  Allergen Reaction Noted  . Penicillins  10/17/2014    ROS:  General: Negative for anorexia, weight loss, fever, chills, fatigue, weakness. ENT: Negative for hoarseness,  difficulty swallowing , nasal congestion. CV: Negative for chest pain, angina, palpitations, dyspnea on exertion, peripheral edema.  Respiratory: Negative for dyspnea at rest, dyspnea on exertion, cough, sputum, wheezing.  GI: See history of present illness. GU:  Negative for dysuria, hematuria, urinary incontinence, urinary frequency, nocturnal urination.  Endo: Negative for unusual weight change.    Physical Examination:   BP (!) 92/57 (BP Location: Right Arm, Patient Position: Sitting, Cuff Size: Normal)   Pulse (!) 48   Ht 5\' 10"  (1.778 m)   Wt 225 lb (102.1 kg) Comment: wheelchair  BMI 32.28 kg/m   General: Well-nourished, well-developed in no acute distress.  Eyes: No icterus. Conjunctivae pink. Mouth: Oropharyngeal mucosa moist and pink , no lesions erythema or exudate. Lungs: Clear to auscultation bilaterally. Non-labored. Heart: Regular rate and rhythm, no murmurs rubs or gallops.  Abdomen: Bowel sounds are normal, nontender, nondistended, no hepatosplenomegaly or masses, no abdominal bruits or hernia , no rebound or guarding.   Extremities: No lower extremity edema. No clubbing or deformities. Neuro: Right hemiparesis Skin: Warm and dry, no jaundice.   Psych: Alert and cooperative, normal mood and affect.  Labs:    Imaging Studies: [place] Abdomen Complete  Result Date: 02/21/2017 CLINICAL DATA:  Abnormal LFTs EXAM: ABDOMEN ULTRASOUND COMPLETE COMPARISON:  None. FINDINGS: Gallbladder: No gallstones or wall thickening visualized. No sonographic Murphy sign noted by sonographer. Common bile duct: Diameter: 3.2 mm Liver: No focal mass in  the liver. Mild nodular contour to the liver not excluded on provided images. The liver appears subjectively small. Portal vein is patent on color Doppler imaging with normal direction of blood flow towards the liver. IVC: No abnormality visualized. Pancreas: Not visualized. Spleen: Size and appearance within normal limits. Right Kidney: Length: 10  cm. Mildly prominent renal pelvis without caliectasis. Left Kidney: Length: 10.4 cm. Parapelvic cyst versus extrarenal pelvis. No caliectasis. Abdominal aorta: No aneurysm visualized. Other findings: A right pleural effusion is identified. Ascites is seen throughout the abdomen. The postvoid volume of the bladder is 140 cc. IMPRESSION: 1. Significant ascites in the abdomen and pelvis. 2. Right pleural effusion. 3. The liver appears subjectively small. A subtle nodular contour is not excluded. No focal mass. 4. The pancreas was not visualized. 5. Mildly prominent renal pelvis pelvises with no caliectasis/hydronephrosis. Electronically Signed   By: Gerome Samavid  Williams III M.D   On: 02/21/2017 17:18   Dg Chest Port 1 View  Result Date: 02/21/2017 CLINICAL DATA:  Shortness of Breath EXAM: PORTABLE CHEST 1 VIEW COMPARISON:  None. FINDINGS: Cardiomegaly with vascular congestion. Small bilateral pleural effusions, right greater than left with bibasilar opacities, likely atelectasis. No acute bony abnormality. IMPRESSION: Cardiomegaly with vascular congestion. Bilateral effusions and bibasilar atelectasis, right greater than left. Electronically Signed   By: Charlett NoseKevin  Dover M.D.   On: 02/21/2017 13:35    Assessment and Plan:   Danny [name] is a 66 y.o. y/o male who was recently in the hospital for congestive heart failure and was found to have abnormal liver enzymes.  The patient's liver enzymes were trending down before he was discharged from the hospital.  The patient will have his labs checked again today to see if his liver enzymes continue to decrease.  The likely cause of the patient's abnormal liver enzymes during the hospital stay was shock liver from his congestive heart failure.  The patient has been explained the plan and agrees with it.    [name] Gyselle Matthew, MD. Clementeen GrahamFACG   Note: This dictation was prepared with Dragon dictation along with smaller phrase technology. Any transcriptional errors that result  from this process are unintentional.

## 2017-03-25 ENCOUNTER — Ambulatory Visit: Payer: Medicare Other | Admitting: Family

## 2017-03-26 ENCOUNTER — Emergency Department
Admission: EM | Admit: 2017-03-26 | Discharge: 2017-03-26 | Disposition: A | Discharge status: Home / Self Care | Payer: Medicare Other | Attending: Emergency Medicine | Admitting: Emergency Medicine

## 2017-03-26 ENCOUNTER — Other Ambulatory Visit: Payer: Self-pay

## 2017-03-26 ENCOUNTER — Encounter: Payer: Self-pay | Admitting: Emergency Medicine

## 2017-03-26 DIAGNOSIS — Z79899 Other long term (current) drug therapy: Secondary | ICD-10-CM | POA: Diagnosis not present

## 2017-03-26 DIAGNOSIS — I509 Heart failure, unspecified: Secondary | ICD-10-CM | POA: Diagnosis not present

## 2017-03-26 DIAGNOSIS — Z7982 Long term (current) use of aspirin: Secondary | ICD-10-CM | POA: Diagnosis not present

## 2017-03-26 DIAGNOSIS — R29898 Other symptoms and signs involving the musculoskeletal system: Secondary | ICD-10-CM | POA: Insufficient documentation

## 2017-03-26 DIAGNOSIS — Z87891 Personal history of nicotine dependence: Secondary | ICD-10-CM | POA: Diagnosis not present

## 2017-03-26 DIAGNOSIS — J449 Chronic obstructive pulmonary disease, unspecified: Secondary | ICD-10-CM | POA: Insufficient documentation

## 2017-03-26 DIAGNOSIS — I110 Hypertensive heart disease with heart failure: Secondary | ICD-10-CM | POA: Insufficient documentation

## 2017-03-26 DIAGNOSIS — Z880 Allergy status to penicillin: Secondary | ICD-10-CM | POA: Insufficient documentation

## 2017-03-26 DIAGNOSIS — J45909 Unspecified asthma, uncomplicated: Secondary | ICD-10-CM | POA: Diagnosis not present

## 2017-03-26 DIAGNOSIS — E119 Type 2 diabetes mellitus without complications: Secondary | ICD-10-CM | POA: Insufficient documentation

## 2017-03-26 DIAGNOSIS — Z7901 Long term (current) use of anticoagulants: Secondary | ICD-10-CM | POA: Diagnosis not present

## 2017-03-26 DIAGNOSIS — M79652 Pain in left thigh: Secondary | ICD-10-CM | POA: Diagnosis present

## 2017-03-26 LAB — COMPREHENSIVE METABOLIC PANEL
ALBUMIN: 4.3 g/dL (ref 3.5–5.0)
ALK PHOS: 110 U/L (ref 38–126)
ALT: 38 U/L (ref 17–63)
ANION GAP: 7 (ref 5–15)
AST: 38 U/L (ref 15–41)
BUN: 19 mg/dL (ref 6–20)
CALCIUM: 9.3 mg/dL (ref 8.9–10.3)
CO2: 30 mmol/L (ref 22–32)
CREATININE: 1.09 mg/dL (ref 0.61–1.24)
Chloride: 98 mmol/L — ABNORMAL LOW (ref 101–111)
GFR calc Af Amer: >60 mL/min (ref >60)
GFR calc non Af Amer: >60 mL/min (ref >60)
GLUCOSE: 109 mg/dL — AB (ref 65–99)
Potassium: 4.4 mmol/L (ref 3.5–5.1)
Sodium: 135 mmol/L (ref 135–145)
Total Bilirubin: 1.2 mg/dL (ref 0.3–1.2)
Total Protein: 7.3 g/dL (ref 6.5–8.1)

## 2017-03-26 LAB — CBC WITH DIFFERENTIAL/PLATELET
BASOS PCT: 1 %
Basophils Absolute: 0 10*3/uL (ref 0–0.1)
Eosinophils Absolute: 0 10*3/uL (ref 0–0.7)
Eosinophils Relative: 0 %
HEMATOCRIT: 44 % (ref 40.0–52.0)
Hemoglobin: 14.4 g/dL (ref 13.0–18.0)
LYMPHS ABS: 1.1 10*3/uL (ref 1.0–3.6)
Lymphocytes Relative: 11 %
MCH: 27.3 pg (ref 26.0–34.0)
MCHC: 32.7 g/dL (ref 32.0–36.0)
MCV: 83.4 fL (ref 80.0–100.0)
MONO ABS: 0.7 10*3/uL (ref 0.2–1.0)
MONOS PCT: 7 %
NEUTROS ABS: 8.1 10*3/uL — AB (ref 1.4–6.5)
Neutrophils Relative %: 81 %
Platelets: 122 10*3/uL — ABNORMAL LOW (ref 150–440)
RBC: 5.28 MIL/uL (ref 4.40–5.90)
RDW: 18.2 % — AB (ref 11.5–14.5)
WBC: 9.9 10*3/uL (ref 3.8–10.6)

## 2017-03-26 LAB — URINALYSIS, COMPLETE (UACMP) WITH MICROSCOPIC
BACTERIA UA: NONE SEEN
Bilirubin Urine: NEGATIVE
Glucose, UA: NEGATIVE mg/dL
Hgb urine dipstick: NEGATIVE
KETONES UR: NEGATIVE mg/dL
Leukocytes, UA: NEGATIVE
Nitrite: NEGATIVE
PROTEIN: 30 mg/dL — AB
SQUAMOUS EPITHELIAL / LPF: NONE SEEN
Specific Gravity, Urine: 1.012 (ref 1.005–1.030)
pH: 6 (ref 5.0–8.0)

## 2017-03-26 LAB — MAGNESIUM: Magnesium: 2 mg/dL (ref 1.7–2.4)

## 2017-03-26 MED ORDER — LORAZEPAM 2 MG/ML IJ SOLN
INTRAMUSCULAR | Status: AC
  Filled 2017-03-26: qty 1

## 2017-03-26 NOTE — ED Notes (Signed)
First nurse note: Pt arrived via EMS. EMS reports pt received a day pass out from [facility]. Pt reportedly walked around town with his girlfriend and upon trying to walk back to his girlfriend's apartment his legs got weak. Pt sat outside until EMS arrived. EMS reports VSS.

## 2017-03-26 NOTE — ED Triage Notes (Signed)
Pt reports had no pain in right leg this morning. His girlfriend had him walk up stairs today and he thinks he pulled a muscle in right thigh. He has soreness now to this muscle.  No pain at all prior to this.  "I think I strained the muscle because I haven't walked up stairs in a while".  Is oriented at this time. Now has pain with walking to right thigh.  Unable to see in triage.

## 2017-03-26 NOTE — ED Notes (Signed)
See triage note  Presents to ED via ems with weakness  States he was trying to walk up stairs and his legs became weak  States he did not fall.

## 2017-03-26 NOTE — ED Notes (Signed)
EMS contacted to transport pt back to facility 

## 2017-03-26 NOTE — ED Provider Notes (Signed)
[facility] Emergency Department Provider Note   ____________________________________________   First MD Initiated Contact with Patient 03/26/17 1646     (approximate)  I have reviewed the triage vital signs and the nursing notes.   HISTORY  Chief Complaint Leg Pain  HPI Danny [name] is a 66 y.o. male is  here with complaint of left thigh pain.  Patient states that he was walking with his girlfriend up some steps and began having some pain and soreness.  Patient states he is not used to walking a lot.  He states that he stays with [facility] but was out for the day with his girlfriend.  He denies any direct injury to his leg.  He states occasionally his leg "feels weak".  He does state that he takes Lasix  which reduces the swelling in his legs.  He occasionally also has muscle cramps.  He is not taking any over-the-counter medication for his pain.  He rates his pain as 9/10.   Past Medical History:  Diagnosis Date  . Arrhythmia    atrial fib  . Cardiomyopathy (HCC)   . CHF (congestive heart failure) (HCC)   . COPD (chronic obstructive pulmonary disease) (HCC)   . Hypertension     Patient Active Problem List   Diagnosis Date Noted  . Atrial fibrillation (HCC) 03/05/2017  . Leg wound, right 03/05/2017  . Abnormal LFTs   . DNR (do not resuscitate)   . Palliative care by specialist   . CHF (congestive heart failure) (HCC) 02/21/2017  . Diabetes (HCC) 10/17/2014  . Hypertension 09/06/2014  . Cardiomyopathy (HCC) 09/06/2014  . COPD (chronic obstructive pulmonary disease) (HCC) 02/02/2013    Past Surgical History:  Procedure Laterality Date  . HERNIA REPAIR     x 3  . WRIST FRACTURE SURGERY Right 05/2016    Prior to Admission medications   Medication Sig Start Date End Date Taking? Authorizing Provider  apixaban (ELIQUIS) 5 MG TABS tablet Take 1 tablet (5 mg total) by mouth 2 (two) times daily. 02/27/17   Milagros Loll, MD    aspirin EC 81 MG tablet Take 81 mg daily by mouth.    [provider]  digoxin (LANOXIN) 0.125 MG tablet Take 1 tablet (0.125 mg total) by mouth daily. 02/27/17   Milagros Loll, MD  furosemide (LASIX) 40 MG tablet Take 1 tablet (40 mg total) by mouth 2 (two) times daily. 02/27/17 02/27/18  Milagros Loll, MD  ipratropium-albuterol (DUONEB) 0.5-2.5 (3) MG/3ML SOLN Take 3 mLs by nebulization every 6 (six) hours as needed. 02/27/17   Milagros Loll, MD  magnesium oxide (MAG-OX) 400 (241.3 Mg) MG tablet Take 1 tablet (400 mg total) by mouth daily. 02/28/17   Milagros Loll, MD  metoprolol tartrate (LOPRESSOR) 25 MG tablet Take 0.5 tablets (12.5 mg total) by mouth 2 (two) times daily. 02/27/17   Sudini, Wardell Heath, MD  sacubitril-valsartan (ENTRESTO) 24-26 MG Take 1 tablet by mouth 2 (two) times daily.    [provider]  silver sulfADIAZINE (SILVADENE) 1 % cream Apply to affected area daily 07/10/16   Emily Filbert, MD  Tiotropium Bromide-Olodaterol (STIOLTO RESPIMAT) 2.5-2.5 MCG/ACT AERS Inhale 1 puff daily into the lungs.    [provider]    Allergies Penicillins  History reviewed. No pertinent family history.  Social History Social History   Tobacco Use  . Smoking status: Former Smoker    Types: Cigarettes    Last attempt to quit: 10/05/2004  Years since quitting: 12.4  . Smokeless tobacco: Never Used  Substance Use Topics  . Alcohol use: No  . Drug use: No    Review of Systems Constitutional: No fever/chills Eyes: No visual changes. Cardiovascular: Denies chest pain. Respiratory: Denies shortness of breath. Gastrointestinal: No abdominal pain.  No nausea, no vomiting.  No diarrhea.  No constipation. Genitourinary: Negative for dysuria.  Musculoskeletal: positive for left upper leg pain Skin: Negative for rash. Neurological: Negative for headaches, focal weakness or numbness.  ____________________________________________   PHYSICAL  EXAM:  VITAL SIGNS: ED Triage Vitals [03/26/17 1628]  Enc Vitals Group     BP 113/64     Pulse Rate 81     Resp 16     Temp 97.9 F (36.6 C)     Temp Source Oral     SpO2 98 %     Weight 161 lb (73 kg)     Height 5\' 10"  (1.778 m)     Head Circumference      Peak Flow      Pain Score 9     Pain Loc      Pain Edu?      Excl. in GC?    Constitutional: Alert and oriented. Well appearing and in no acute distress. Eyes: Conjunctivae are normal.  Head: Atraumatic. Nose: No congestion/rhinnorhea. Neck: No stridor.   Cardiovascular: Normal rate, regular rhythm. Grossly normal heart sounds.  Good peripheral circulation. Respiratory: Normal respiratory effort.  No retractions. Lungs CTAB. Gastrointestinal: Soft and nontender. No distention.  Musculoskeletal: on examination of the left anterior thigh there is no gross deformity or soft tissue swelling. There is no erythema or ecchymosis present. Range of motion of the lower extremity is without pain or restriction. Motor sensory function intact. Skin is grossly intact. There appears to be some superficial scratches mostly self-inflicted. Neurologic:  Normal speech and language. No gross focal neurologic deficits are appreciated. No gait instability. Skin:  Skin is warm, dry. Superficial self-inflicted scratches to bilateral anterior legs. No ecchymosis or abrasions seen. Psychiatric: Mood and affect are normal. Speech and behavior are normal.  ____________________________________________   LABS (all labs ordered are listed, but only abnormal results are displayed)  Labs Reviewed  COMPREHENSIVE METABOLIC PANEL - Abnormal; Notable for the following components:      Result Value   Chloride 98 (*)    Glucose, Bld 109 (*)    All other components within normal limits  CBC WITH DIFFERENTIAL/PLATELET - Abnormal; Notable for the following components:   RDW 18.2 (*)    Platelets 122 (*)    Neutro Abs 8.1 (*)    All other components within  normal limits  URINALYSIS, COMPLETE (UACMP) WITH MICROSCOPIC - Abnormal; Notable for the following components:   Color, Urine YELLOW (*)    APPearance CLEAR (*)    Protein, ur 30 (*)    All other components within normal limits  MAGNESIUM     PROCEDURES  Procedure(s) performed: None  Procedures  Critical Care performed: No  ____________________________________________   INITIAL IMPRESSION / ASSESSMENT AND PLAN / ED COURSE Patient will return to [facility] via EMS. He was made aware of his lab findings. He will continue his regular medication as prescribed by his doctor and follow-up with his PCP next week.  ____________________________________________   FINAL CLINICAL IMPRESSION(S) / ED DIAGNOSES  Final diagnoses:  Leg weakness, bilateral     ED Discharge Orders    None       Note:  This document was prepared using Dragon voice recognition software and may include unintentional dictation errors.    Tommi RumpsSummers, Rhonda L, PA-C 03/26/17 1906    Emily FilbertWilliams, Jonathan E, MD 03/26/17 2216

## 2017-03-26 NOTE — Discharge Instructions (Signed)
Call and make an appointment with your primary care provider. Continue your medications as prescribed.

## 2017-03-26 NOTE — ED Notes (Signed)
[facility] called to check on pt .

## 2017-05-14 ENCOUNTER — Emergency Department
Admission: EM | Admit: 2017-05-14 | Discharge: 2017-05-14 | Disposition: A | Discharge status: Home / Self Care | Payer: Medicare Other | Attending: Emergency Medicine | Admitting: Emergency Medicine

## 2017-05-14 ENCOUNTER — Other Ambulatory Visit: Payer: Self-pay

## 2017-05-14 ENCOUNTER — Emergency Department: Payer: Medicare Other

## 2017-05-14 ENCOUNTER — Encounter: Payer: Self-pay | Admitting: Emergency Medicine

## 2017-05-14 DIAGNOSIS — R2243 Localized swelling, mass and lump, lower limb, bilateral: Secondary | ICD-10-CM | POA: Insufficient documentation

## 2017-05-14 DIAGNOSIS — Z9114 Patient's other noncompliance with medication regimen: Secondary | ICD-10-CM | POA: Insufficient documentation

## 2017-05-14 DIAGNOSIS — J449 Chronic obstructive pulmonary disease, unspecified: Secondary | ICD-10-CM | POA: Insufficient documentation

## 2017-05-14 DIAGNOSIS — Z87891 Personal history of nicotine dependence: Secondary | ICD-10-CM | POA: Diagnosis not present

## 2017-05-14 DIAGNOSIS — I509 Heart failure, unspecified: Secondary | ICD-10-CM | POA: Diagnosis not present

## 2017-05-14 DIAGNOSIS — I110 Hypertensive heart disease with heart failure: Secondary | ICD-10-CM | POA: Diagnosis not present

## 2017-05-14 DIAGNOSIS — Z7901 Long term (current) use of anticoagulants: Secondary | ICD-10-CM | POA: Insufficient documentation

## 2017-05-14 DIAGNOSIS — E119 Type 2 diabetes mellitus without complications: Secondary | ICD-10-CM | POA: Insufficient documentation

## 2017-05-14 DIAGNOSIS — Z79899 Other long term (current) drug therapy: Secondary | ICD-10-CM | POA: Diagnosis not present

## 2017-05-14 DIAGNOSIS — R600 Localized edema: Secondary | ICD-10-CM

## 2017-05-14 LAB — URINALYSIS, COMPLETE (UACMP) WITH MICROSCOPIC
BACTERIA UA: NONE SEEN
BILIRUBIN URINE: NEGATIVE
Glucose, UA: NEGATIVE mg/dL
KETONES UR: NEGATIVE mg/dL
LEUKOCYTES UA: NEGATIVE
NITRITE: NEGATIVE
PROTEIN: NEGATIVE mg/dL
RBC / HPF: NONE SEEN RBC/hpf (ref 0–5)
Specific Gravity, Urine: 1.004 — ABNORMAL LOW (ref 1.005–1.030)
Squamous Epithelial / LPF: NONE SEEN
pH: 6 (ref 5.0–8.0)

## 2017-05-14 LAB — CK: Total CK: 90 U/L (ref 49–397)

## 2017-05-14 LAB — CBC WITH DIFFERENTIAL/PLATELET
BASOS ABS: 0 10*3/uL (ref 0–0.1)
Basophils Relative: 0 %
EOS PCT: 3 %
Eosinophils Absolute: 0.2 10*3/uL (ref 0–0.7)
HEMATOCRIT: 35.2 % — AB (ref 40.0–52.0)
Hemoglobin: 12.1 g/dL — ABNORMAL LOW (ref 13.0–18.0)
LYMPHS ABS: 1.3 10*3/uL (ref 1.0–3.6)
LYMPHS PCT: 23 %
MCH: 29.3 pg (ref 26.0–34.0)
MCHC: 34.2 g/dL (ref 32.0–36.0)
MCV: 85.7 fL (ref 80.0–100.0)
MONO ABS: 0.4 10*3/uL (ref 0.2–1.0)
Monocytes Relative: 8 %
NEUTROS ABS: 3.8 10*3/uL (ref 1.4–6.5)
Neutrophils Relative %: 66 %
Platelets: 174 10*3/uL (ref 150–440)
RBC: 4.11 MIL/uL — ABNORMAL LOW (ref 4.40–5.90)
RDW: 19.4 % — AB (ref 11.5–14.5)
WBC: 5.7 10*3/uL (ref 3.8–10.6)

## 2017-05-14 LAB — BASIC METABOLIC PANEL
ANION GAP: 9 (ref 5–15)
BUN: 6 mg/dL (ref 6–20)
CO2: 25 mmol/L (ref 22–32)
Calcium: 8.5 mg/dL — ABNORMAL LOW (ref 8.9–10.3)
Chloride: 107 mmol/L (ref 101–111)
Creatinine, Ser: 0.72 mg/dL (ref 0.61–1.24)
GFR calc Af Amer: >60 mL/min (ref >60)
GFR calc non Af Amer: >60 mL/min (ref >60)
GLUCOSE: 99 mg/dL (ref 65–99)
POTASSIUM: 3.7 mmol/L (ref 3.5–5.1)
Sodium: 141 mmol/L (ref 135–145)

## 2017-05-14 LAB — URINE DRUG SCREEN, QUALITATIVE (ARMC ONLY)
Amphetamines, Ur Screen: NOT DETECTED
Barbiturates, Ur Screen: NOT DETECTED
Benzodiazepine, Ur Scrn: NOT DETECTED
COCAINE METABOLITE, UR ~~LOC~~: NOT DETECTED
Cannabinoid 50 Ng, Ur ~~LOC~~: NOT DETECTED
MDMA (ECSTASY) UR SCREEN: NOT DETECTED
METHADONE SCREEN, URINE: NOT DETECTED
OPIATE, UR SCREEN: NOT DETECTED
Phencyclidine (PCP) Ur S: NOT DETECTED
TRICYCLIC, UR SCREEN: NOT DETECTED

## 2017-05-14 LAB — ETHANOL: Alcohol, Ethyl (B): 52 mg/dL — ABNORMAL HIGH (ref <10)

## 2017-05-14 MED ORDER — METOPROLOL TARTRATE 25 MG PO TABS
12.5000 mg | ORAL_TABLET | Freq: Two times a day (BID) | ORAL | Status: DC
  Administered 2017-05-14: 12.5 mg via ORAL
  Filled 2017-05-14: qty 1

## 2017-05-14 MED ORDER — FUROSEMIDE 10 MG/ML IJ SOLN
40.0000 mg | Freq: Once | INTRAMUSCULAR | Status: AC
  Administered 2017-05-14: 40 mg via INTRAVENOUS
  Filled 2017-05-14: qty 4

## 2017-05-14 MED ORDER — APIXABAN 5 MG PO TABS
5.0000 mg | ORAL_TABLET | Freq: Two times a day (BID) | ORAL | Status: DC
  Administered 2017-05-14: 5 mg via ORAL
  Filled 2017-05-14: qty 1

## 2017-05-14 MED ORDER — ASPIRIN 81 MG PO CHEW
81.0000 mg | CHEWABLE_TABLET | Freq: Every day | ORAL | Status: DC
  Administered 2017-05-14: 81 mg via ORAL
  Filled 2017-05-14: qty 1

## 2017-05-14 MED ORDER — FUROSEMIDE 40 MG PO TABS: 40.0000 mg | ORAL_TABLET | Freq: Two times a day (BID) | ORAL | Status: DC

## 2017-05-14 NOTE — ED Triage Notes (Addendum)
Pt to ED via EMS with c/o bilat leg pain and weakness. [facility]. Per facility was looking for pt. Per EMS pt was motel room was found to have packs of beer but no food. Pt denies taking any home meds x6days  Pt states he drinks aprox 12 beers/day. Pt A&Ox4. Social work and facility aware per ems. VS stable, glucose 96 on arrival per ems. Pt taken to decon showers upon arrival

## 2017-05-14 NOTE — Discharge Instructions (Signed)
Please re-start your home meds. Follow up with your doctor. Return to the emergency room for chest pain or shortness of breath.

## 2017-05-14 NOTE — ED Notes (Signed)
Received report from Jordan RN.

## 2017-05-14 NOTE — ED Notes (Signed)
Called [facility] facility to speak with caregiver abt pt being discharged. No answer. Will continue to call back.

## 2017-05-14 NOTE — ED Notes (Signed)
Pt signed hardcopy of E-signature disposition. 

## 2017-05-14 NOTE — ED Provider Notes (Signed)
[facility] Emergency Department Provider Note  ____________________________________________  Time seen: Approximately 5:32 PM  I have reviewed the triage vital signs and the nursing notes.   HISTORY  Chief Complaint Leg Pain   HPI Danny [name] is a 67 y.o. male with a history of cardiomyopathy and EF of 25%, afib on Eliquis,  COPD, hypertension, diabetes who presents for evaluation of bilateral leg pain and swelling. Patient reports that he left his nursing home 6 days agobecause he wanted to try to live on his own. He moved into a motel. He has not been taking any of his home meds. He has been drinking 8-9 beers a day. For the last few days he has had developed progressively worsening bilateral leg pain and swelling. The pain is constant, moderate, dull and worse with weight bearing. He denies prior h/o blood clots. Today he called EMS due to the pain. He denies SOB, CP, orthopnea.   Past Medical History:  Diagnosis Date  . Arrhythmia    atrial fib  . Cardiomyopathy (HCC)   . CHF (congestive heart failure) (HCC)   . COPD (chronic obstructive pulmonary disease) (HCC)   . Hypertension     Patient Active Problem List   Diagnosis Date Noted  . Atrial fibrillation (HCC) 03/05/2017  . Leg wound, right 03/05/2017  . Abnormal LFTs   . DNR (do not resuscitate)   . Palliative care by specialist   . CHF (congestive heart failure) (HCC) 02/21/2017  . Diabetes (HCC) 10/17/2014  . Hypertension 09/06/2014  . Cardiomyopathy (HCC) 09/06/2014  . COPD (chronic obstructive pulmonary disease) (HCC) 02/02/2013    Past Surgical History:  Procedure Laterality Date  . HERNIA REPAIR     x 3  . WRIST FRACTURE SURGERY Right 05/2016    Prior to Admission medications   Medication Sig Start Date End Date Taking? Authorizing Provider  apixaban (ELIQUIS) 5 MG TABS tablet Take 1 tablet (5 mg total) by mouth 2 (two) times daily. 02/27/17  Yes Sudini, Wardell Heath, MD    digoxin (LANOXIN) 0.125 MG tablet Take 1 tablet (0.125 mg total) by mouth daily. Patient taking differently: Take 0.125 mg by mouth daily. Hold if pulse <60 02/27/17  Yes Sudini, Wardell Heath, MD  furosemide (LASIX) 40 MG tablet Take 1 tablet (40 mg total) by mouth 2 (two) times daily. 02/27/17 02/27/18 Yes Sudini, Wardell Heath, MD  magnesium oxide (MAG-OX) 400 (241.3 Mg) MG tablet Take 1 tablet (400 mg total) by mouth daily. 02/28/17  Yes Milagros Loll, MD  metoprolol tartrate (LOPRESSOR) 25 MG tablet Take 0.5 tablets (12.5 mg total) by mouth 2 (two) times daily. 02/27/17  Yes Sudini, Wardell Heath, MD  Tiotropium Bromide-Olodaterol (STIOLTO RESPIMAT) 2.5-2.5 MCG/ACT AERS Inhale 1 puff daily into the lungs.   Yes [provider]  ipratropium-albuterol (DUONEB) 0.5-2.5 (3) MG/3ML SOLN Take 3 mLs by nebulization every 6 (six) hours as needed. Patient not taking: Reported on 05/14/2017 02/27/17   Milagros Loll, MD    Allergies Penicillins  No family history on file.  Social History Social History   Tobacco Use  . Smoking status: Former Smoker    Types: Cigarettes    Last attempt to quit: 10/05/2004    Years since quitting: 12.6  . Smokeless tobacco: Never Used  Substance Use Topics  . Alcohol use: Yes    Comment: 8-12beers/day   . Drug use: No    Review of Systems  Constitutional: Negative for fever. Eyes: Negative for visual changes. ENT: Negative for  sore throat. Neck: No neck pain  Cardiovascular: Negative for chest pain. Respiratory: Negative for shortness of breath. Gastrointestinal: Negative for abdominal pain, vomiting or diarrhea. Genitourinary: Negative for dysuria. Musculoskeletal: Negative for back pain. + b/l leg pain Skin: Negative for rash. Neurological: Negative for headaches, weakness or numbness. Psych: No SI or HI  ____________________________________________   PHYSICAL EXAM:  VITAL SIGNS: ED Triage Vitals  Enc Vitals Group     BP 05/14/17 1714 (!) 152/81      Pulse Rate 05/14/17 1714 81     Resp 05/14/17 1714 16     Temp 05/14/17 1714 98.4 F (36.9 C)     Temp Source 05/14/17 1714 Oral     SpO2 05/14/17 1714 98 %     Weight --      Height --      Head Circumference --      Peak Flow --      Pain Score 05/14/17 1715 5     Pain Loc --      Pain Edu? --      Excl. in GC? --     Constitutional: Alert and oriented. Well appearing and in no apparent distress. HEENT:      Head: Normocephalic and atraumatic.         Eyes: Conjunctivae are normal. Sclera is non-icteric.       Mouth/Throat: Mucous membranes are moist.       Neck: Supple with no signs of meningismus. Cardiovascular: Regular rate and rhythm. No murmurs, gallops, or rubs. 2+ symmetrical distal pulses are present in all extremities. No JVD. Respiratory: Normal respiratory effort. Lungs are clear to auscultation bilaterally. No wheezes, crackles, or rhonchi.  Gastrointestinal: Soft, non tender, and non distended with positive bowel sounds. No rebound or guarding. Musculoskeletal: 2+ pitting edema on b/l LE up to knees, no erythema or warmth Neurologic: Normal speech and language. Face is symmetric. Moving all extremities. No gross focal neurologic deficits are appreciated. Skin: Skin is warm, dry and intact. No rash noted. Psychiatric: Mood and affect are normal. Speech and behavior are normal.  ____________________________________________   LABS (all labs ordered are listed, but only abnormal results are displayed)  Labs Reviewed  CBC WITH DIFFERENTIAL/PLATELET - Abnormal; Notable for the following components:      Result Value   RBC 4.11 (*)    Hemoglobin 12.1 (*)    HCT 35.2 (*)    RDW 19.4 (*)    All other components within normal limits  BASIC METABOLIC PANEL - Abnormal; Notable for the following components:   Calcium 8.5 (*)    All other components within normal limits  ETHANOL - Abnormal; Notable for the following components:   Alcohol, Ethyl (B) 52 (*)    All  other components within normal limits  URINALYSIS, COMPLETE (UACMP) WITH MICROSCOPIC - Abnormal; Notable for the following components:   Color, Urine STRAW (*)    APPearance CLEAR (*)    Specific Gravity, Urine 1.004 (*)    Hgb urine dipstick SMALL (*)    All other components within normal limits  CK  URINE DRUG SCREEN, QUALITATIVE ([facility] ONLY)   ____________________________________________  EKG  ED ECG REPORT I, Nita Sickle, the attending physician, personally viewed and interpreted this ECG.  Atrial fibrillation, rate of 72, normal intervals, frequent PVCs, right axis deviation, no ST elevations or depressions.  ____________________________________________  RADIOLOGY  Interpreted by me: Doppler: negative for DVT   Interpretation by Radiologist:  US Venous Img Lower Bilateral  Result Date: 05/14/2017 CLINICAL DATA:  BILATERAL leg pain and swelling for 1 week EXAM: BILATERAL LOWER EXTREMITY VENOUS DOPPLER ULTRASOUND TECHNIQUE: Gray-scale sonography with graded compression, as well as color Doppler and duplex ultrasound were performed to evaluate the lower extremity deep venous systems from the level of the common femoral vein and including the common femoral, femoral, profunda femoral, popliteal and calf veins including the posterior tibial, peroneal and gastrocnemius veins when visible. The superficial great saphenous vein was also interrogated. Spectral Doppler was utilized to evaluate flow at rest and with distal augmentation maneuvers in the common femoral, femoral and popliteal veins. COMPARISON:  None. FINDINGS: RIGHT LOWER EXTREMITY Common Femoral Vein: No evidence of thrombus. Normal compressibility, respiratory phasicity and response to augmentation. Saphenofemoral Junction: No evidence of thrombus. Normal compressibility and flow on color Doppler imaging. Profunda Femoral Vein: No evidence of thrombus. Normal compressibility and flow on color Doppler imaging. Femoral Vein:  No evidence of thrombus. Normal compressibility, respiratory phasicity and response to augmentation. Popliteal Vein: No evidence of thrombus. Normal compressibility, respiratory phasicity and response to augmentation. Calf Veins: No evidence of thrombus. Normal compressibility and flow on color Doppler imaging. Superficial Great Saphenous Vein: No evidence of thrombus. Normal compressibility. Venous Reflux:  None. Other Findings:  None. LEFT LOWER EXTREMITY Common Femoral Vein: No evidence of thrombus. Normal compressibility, respiratory phasicity and response to augmentation. Saphenofemoral Junction: No evidence of thrombus. Normal compressibility and flow on color Doppler imaging. Profunda Femoral Vein: No evidence of thrombus. Normal compressibility and flow on color Doppler imaging. Femoral Vein: No evidence of thrombus. Normal compressibility, respiratory phasicity and response to augmentation. Popliteal Vein: No evidence of thrombus. Normal compressibility, respiratory phasicity and response to augmentation. Calf Veins: No evidence of thrombus. Normal compressibility and flow on color Doppler imaging. Superficial Great Saphenous Vein: No evidence of thrombus. Normal compressibility. Venous Reflux:  None. Other Findings:  None. IMPRESSION: No evidence of deep venous thrombosis in either lower extremity. Electronically Signed   By: Ulyses SouthwardMark  Boles M.D.   On: 05/14/2017 18:35    ____________________________________________   PROCEDURES  Procedure(s) performed: None Procedures Critical Care performed:  None ____________________________________________   INITIAL IMPRESSION / ASSESSMENT AND PLAN / ED COURSE  67 y.o. male with a history of cardiomyopathy and EF of 25%, afib on Eliquis, COPD, hypertension, diabetes who presents for evaluation of bilateral leg pain and swelling in the setting of 6 days of medication non compliance and alcohol binge. Patient is well-appearing, in no distress, normal vital  signs, normal work of breathing, lungs are clear, he does have 2+ pitting edema on bilateral lower extremities all the way to the knees. Patient supposed to be on Lasix and Eliquis however has not taken any of his medications for 6 days. No prior history of PE or DVT. We'll send patient for ultrasound of his bilateral lower extremities to rule out a blood clot. This is most likely volume overload from medication noncompliance. We'll restart patient on his home medications and given Lasix. The patient is willing to go back to his nursing home.  Clinical Course as of May 14 1909  Thu May 14, 2017  1907 Ultrasound negative for blood clots. Labs were no acute findings. Patient was restarted on his home medications. I spoke with [facility] and they have accepted patient back. Patient will be sent back this evening.  [CV]    Clinical Course User Index [CV] Nita SickleVeronese, [place], MD     As part of my medical decision making, I  reviewed the following data within the electronic MEDICAL RECORD NUMBER Nursing notes reviewed and incorporated, Labs reviewed , EKG interpreted , Old chart reviewed, Radiograph reviewed , Notes from prior ED visits and [place] Controlled Substance Database    Pertinent labs & imaging results that were available during my care of the patient were reviewed by me and considered in my medical decision making (see chart for details).    ____________________________________________   FINAL CLINICAL IMPRESSION(S) / ED DIAGNOSES  Final diagnoses:  Bilateral leg edema  Non compliance w medication regimen      NEW MEDICATIONS STARTED DURING THIS VISIT:  ED Discharge Orders    None       Note:  This document was prepared using Dragon voice recognition software and may include unintentional dictation errors.    Don Perking, [place], MD 05/14/17 [phone]

## 2017-05-14 NOTE — ED Notes (Signed)
Removed IV for pt. Transport team here to take pt back to facility.

## 2017-05-14 NOTE — ED Notes (Addendum)
[facility]

## 2017-05-14 NOTE — ED Notes (Signed)
[facility] made aware that pt is in our facility , this RN spoke with [place], RN

## 2017-05-14 NOTE — ED Notes (Signed)
Patient transported to Ultrasound 

## 2017-08-12 ENCOUNTER — Emergency Department
Admission: EM | Admit: 2017-08-12 | Discharge: 2017-08-13 | Disposition: A | Discharge status: Home / Self Care | Payer: Medicare Other | Attending: Emergency Medicine | Admitting: Emergency Medicine

## 2017-08-12 ENCOUNTER — Emergency Department: Payer: Medicare Other

## 2017-08-12 ENCOUNTER — Other Ambulatory Visit: Payer: Self-pay

## 2017-08-12 DIAGNOSIS — R42 Dizziness and giddiness: Secondary | ICD-10-CM | POA: Diagnosis not present

## 2017-08-12 DIAGNOSIS — Z7901 Long term (current) use of anticoagulants: Secondary | ICD-10-CM | POA: Insufficient documentation

## 2017-08-12 DIAGNOSIS — E119 Type 2 diabetes mellitus without complications: Secondary | ICD-10-CM | POA: Insufficient documentation

## 2017-08-12 DIAGNOSIS — R0789 Other chest pain: Secondary | ICD-10-CM | POA: Diagnosis not present

## 2017-08-12 DIAGNOSIS — J449 Chronic obstructive pulmonary disease, unspecified: Secondary | ICD-10-CM | POA: Diagnosis not present

## 2017-08-12 DIAGNOSIS — I509 Heart failure, unspecified: Secondary | ICD-10-CM | POA: Diagnosis not present

## 2017-08-12 DIAGNOSIS — R0602 Shortness of breath: Secondary | ICD-10-CM | POA: Diagnosis not present

## 2017-08-12 DIAGNOSIS — Z87891 Personal history of nicotine dependence: Secondary | ICD-10-CM | POA: Insufficient documentation

## 2017-08-12 DIAGNOSIS — Z79899 Other long term (current) drug therapy: Secondary | ICD-10-CM | POA: Insufficient documentation

## 2017-08-12 DIAGNOSIS — I110 Hypertensive heart disease with heart failure: Secondary | ICD-10-CM | POA: Diagnosis not present

## 2017-08-12 DIAGNOSIS — R079 Chest pain, unspecified: Secondary | ICD-10-CM | POA: Diagnosis present

## 2017-08-12 LAB — COMPREHENSIVE METABOLIC PANEL
ALBUMIN: 3.7 g/dL (ref 3.5–5.0)
ALK PHOS: 51 U/L (ref 38–126)
ALT: 12 U/L — AB (ref 17–63)
ANION GAP: 6 (ref 5–15)
AST: 18 U/L (ref 15–41)
BUN: 12 mg/dL (ref 6–20)
CALCIUM: 8.3 mg/dL — AB (ref 8.9–10.3)
CHLORIDE: 109 mmol/L (ref 101–111)
CO2: 27 mmol/L (ref 22–32)
Creatinine, Ser: 0.79 mg/dL (ref 0.61–1.24)
GFR calc Af Amer: >60 mL/min (ref >60)
GFR calc non Af Amer: >60 mL/min (ref >60)
Glucose, Bld: 123 mg/dL — ABNORMAL HIGH (ref 65–99)
Potassium: 3 mmol/L — ABNORMAL LOW (ref 3.5–5.1)
SODIUM: 142 mmol/L (ref 135–145)
Total Bilirubin: 0.8 mg/dL (ref 0.3–1.2)
Total Protein: 6.3 g/dL — ABNORMAL LOW (ref 6.5–8.1)

## 2017-08-12 LAB — CBC WITH DIFFERENTIAL/PLATELET
BASOS ABS: 0 10*3/uL (ref 0–0.1)
Basophils Relative: 1 %
EOS ABS: 0.1 10*3/uL (ref 0–0.7)
Eosinophils Relative: 2 %
HCT: 40.7 % (ref 40.0–52.0)
HEMOGLOBIN: 13.9 g/dL (ref 13.0–18.0)
Lymphocytes Relative: 21 %
Lymphs Abs: 1.4 10*3/uL (ref 1.0–3.6)
MCH: 29.9 pg (ref 26.0–34.0)
MCHC: 34.1 g/dL (ref 32.0–36.0)
MCV: 87.7 fL (ref 80.0–100.0)
MONO ABS: 0.5 10*3/uL (ref 0.2–1.0)
Monocytes Relative: 7 %
NEUTROS ABS: 4.8 10*3/uL (ref 1.4–6.5)
NEUTROS PCT: 69 %
PLATELETS: 141 10*3/uL — AB (ref 150–440)
RBC: 4.65 MIL/uL (ref 4.40–5.90)
RDW: 14.9 % — ABNORMAL HIGH (ref 11.5–14.5)
WBC: 6.8 10*3/uL (ref 3.8–10.6)

## 2017-08-12 LAB — TROPONIN I: Troponin I: 0.04 ng/mL (ref <0.03)

## 2017-08-12 LAB — ETHANOL

## 2017-08-12 LAB — BRAIN NATRIURETIC PEPTIDE: B NATRIURETIC PEPTIDE 5: 1144 pg/mL — AB (ref 0.0–100.0)

## 2017-08-12 MED ORDER — POTASSIUM CHLORIDE CRYS ER 20 MEQ PO TBCR
20.0000 meq | EXTENDED_RELEASE_TABLET | Freq: Once | ORAL | Status: AC
  Administered 2017-08-13: 20 meq via ORAL
  Filled 2017-08-12: qty 1

## 2017-08-12 NOTE — ED Notes (Signed)
Pt in bed with rails upx2, on monitor, AOx4, VSS, he denies any nausea, vomiting or headache. We will continue to monitor the pt.

## 2017-08-12 NOTE — ED Provider Notes (Signed)
[facility] Emergency Department Provider Note ____________________________________________   First MD Initiated Contact with Patient 08/12/17 1846     (approximate)  I have reviewed the triage vital signs and the nursing notes.   HISTORY  Chief Complaint Chest Pain    HPI Danny [name] is a 66 y.o. male with PMH as noted below who presents with chest discomfort, acute onset today, described as pounding, and now improved.  He states that his chest just feels a little bit sore at this time.  He reports mild shortness of breath associated with it.  Patient states that he felt today after feeling dizzy although the dizziness is now improved as well.  The patient states that he was discharged from a nursing home several weeks ago and states that he stopped taking all of his medications because he did not like how they made him feel.  Patient states that he also feels lonely, and states that he wants to go back to the nursing home.   Past Medical History:  Diagnosis Date  . Arrhythmia    atrial fib  . Cardiomyopathy (HCC)   . CHF (congestive heart failure) (HCC)   . COPD (chronic obstructive pulmonary disease) (HCC)   . Hypertension     Patient Active Problem List   Diagnosis Date Noted  . Atrial fibrillation (HCC) 03/05/2017  . Leg wound, right 03/05/2017  . Abnormal LFTs   . DNR (do not resuscitate)   . Palliative care by specialist   . CHF (congestive heart failure) (HCC) 02/21/2017  . Diabetes (HCC) 10/17/2014  . Hypertension 09/06/2014  . Cardiomyopathy (HCC) 09/06/2014  . COPD (chronic obstructive pulmonary disease) (HCC) 02/02/2013    Past Surgical History:  Procedure Laterality Date  . HERNIA REPAIR     x 3  . WRIST FRACTURE SURGERY Right 05/2016    Prior to Admission medications   Medication Sig Start Date End Date Taking? Authorizing Provider  apixaban (ELIQUIS) 5 MG TABS tablet Take 1 tablet (5 mg total) by mouth 2 (two)  times daily. 02/27/17   Milagros Loll, MD  digoxin (LANOXIN) 0.125 MG tablet Take 1 tablet (0.125 mg total) by mouth daily. Patient taking differently: Take 0.125 mg by mouth daily. Hold if pulse <60 02/27/17   Sudini, Wardell Heath, MD  furosemide (LASIX) 40 MG tablet Take 1 tablet (40 mg total) by mouth 2 (two) times daily. 02/27/17 02/27/18  Milagros Loll, MD  ipratropium-albuterol (DUONEB) 0.5-2.5 (3) MG/3ML SOLN Take 3 mLs by nebulization every 6 (six) hours as needed. Patient not taking: Reported on 05/14/2017 02/27/17   Milagros Loll, MD  magnesium oxide (MAG-OX) 400 (241.3 Mg) MG tablet Take 1 tablet (400 mg total) by mouth daily. 02/28/17   Milagros Loll, MD  metoprolol tartrate (LOPRESSOR) 25 MG tablet Take 0.5 tablets (12.5 mg total) by mouth 2 (two) times daily. 02/27/17   Milagros Loll, MD  Tiotropium Bromide-Olodaterol (STIOLTO RESPIMAT) 2.5-2.5 MCG/ACT AERS Inhale 1 puff daily into the lungs.    [provider]    Allergies Penicillins  History reviewed. No pertinent family history.  Social History Social History   Tobacco Use  . Smoking status: Former Smoker    Types: Cigarettes    Last attempt to quit: 10/05/2004    Years since quitting: 12.8  . Smokeless tobacco: Never Used  Substance Use Topics  . Alcohol use: Yes    Comment: 8-12beers/day   . Drug use: No    Review of Systems  Constitutional: No fever. Eyes: No redness. ENT: No sore throat. Cardiovascular: Positive for resolving chest pain. Respiratory: Positive for shortness of breath. Gastrointestinal: No vomiting.  Genitourinary: Negative for dysuria.  Musculoskeletal: Negative for back pain. Skin: Negative for rash. Neurological: Negative for headache.   ____________________________________________   PHYSICAL EXAM:  VITAL SIGNS: ED Triage Vitals  Enc Vitals Group     BP 08/12/17 1857 (!) 127/93     Pulse Rate 08/12/17 1857 69     Resp 08/12/17 1857 18     Temp 08/12/17 1857 98.4 F  (36.9 C)     Temp Source 08/12/17 1857 Oral     SpO2 08/12/17 1857 96 %     Weight 08/12/17 1859 190 lb (86.2 kg)     Height 08/12/17 1859  (1.778 m)     Head Circumference --      Peak Flow --      Pain Score 08/12/17 1858 6     Pain Loc --      Pain Edu? --      Excl. in GC? --     Constitutional: Alert and oriented. Well appearing and in no acute distress. Eyes: Conjunctivae are normal.  EOMI.  PERRLA. Head: Atraumatic. Nose: No congestion/rhinnorhea. Mouth/Throat: Mucous membranes are slightly dry.   Neck: Normal range of motion.  Cardiovascular: Normal rate, regular rhythm. Grossly normal heart sounds.  Good peripheral circulation. Respiratory: Normal respiratory effort.  No retractions. Lungs CTAB. Gastrointestinal: Soft and nontender. No distention.  Genitourinary: No flank motor intact in all extremities.  Tenderness. Musculoskeletal: No lower extremity edema.  Extremities warm and well perfused.  Neurologic:  Normal speech and language. No gross focal neurologic deficits are appreciated.  Skin:  Skin is warm and dry. No rash noted. Psychiatric: Mood and affect are normal. Speech and behavior are normal.  ____________________________________________   LABS (all labs ordered are listed, but only abnormal results are displayed)  Labs Reviewed  COMPREHENSIVE METABOLIC PANEL - Abnormal; Notable for the following components:      Result Value   Potassium 3.0 (*)    Glucose, Bld 123 (*)    Calcium 8.3 (*)    Total Protein 6.3 (*)    ALT 12 (*)    All other components within normal limits  BRAIN NATRIURETIC PEPTIDE - Abnormal; Notable for the following components:   B Natriuretic Peptide 1,144.0 (*)    All other components within normal limits  TROPONIN I - Abnormal; Notable for the following components:   Troponin I 0.04 (*)    All other components within normal limits  CBC WITH DIFFERENTIAL/PLATELET - Abnormal; Notable for the following components:   RDW  14.9 (*)    Platelets 141 (*)    All other components within normal limits  TROPONIN I - Abnormal; Notable for the following components:   Troponin I 0.04 (*)    All other components within normal limits  ETHANOL  URINALYSIS, COMPLETE (UACMP) WITH MICROSCOPIC   ____________________________________________  EKG  ED ECG REPORT I, Dionne Bucy, the attending physician, personally viewed and interpreted this ECG.  Date: 08/12/2017 EKG Time: 1830 Rate: 69 Rhythm: normal sinus rhythm QRS Axis: normal Intervals: Nonspecific intraventricular block ST/T Wave abnormalities: Nonspecific T wave abnormalities inferior and lateral Narrative Interpretation: no evidence of acute ischemia; no significant change when compared to EKG of 05/15/2017  ____________________________________________  RADIOLOGY   CXR: No focal infiltrate or other acute findings ____________________________________________   PROCEDURES  Procedure(s) performed: No  Procedures  Critical Care performed: No ____________________________________________   INITIAL IMPRESSION / ASSESSMENT AND PLAN / ED COURSE  Pertinent labs & imaging results that were available during my care of the patient were reviewed by me and considered in my medical decision making (see chart for details).  67 year old male with PMH as noted above presents with chest discomfort described as pounding which is now mostly resolved as well as an episode of dizziness today which caused him to fall.  He denies any associated trauma and had no LOC.  I reviewed the past medical records in Epic; the patient was most recently seen in the ED on 05/14/2017 for leg swelling and was discharged.  Patient states that he was discharged from the nursing home, [facility], several weeks ago.  He states that he has not taken any of his medications since this time because he does not like how they make him feel, and he also states that he wants to go back to  the nursing home because he is lonely and has difficulty taking care of himself.  On exam, the patient is relatively comfortable appearing.  Vital signs are normal, and the remainder the exam is as described above.  Overall I have low suspicion for ACS given the atypical nature of the discomfort.  Differential includes dehydration or other metabolic etiology, urinary tract infection, other infectious cause, or less likely cardiac.  Plan: Basic and cardiac labs, chest x-ray and UA, and reassess.  If no acute medical issues then we will board the patient and have social work evaluation for possible readmission to nursing home if patient qualifies.    Clinical Course as of Aug 12 2336  Wed Aug 12, 2017  2159 Potassium(!): 3.0 [SS]    Clinical Course User Index [SS] Dionne Bucy, MD   ----------------------------------------- 11:36 PM on 08/12/2017 -----------------------------------------  Patient's troponin was minimally elevated.  Repeat after several hours is unchanged.  Both these are lower than his troponin has run in the past, and based on this plus the lack of EKG changes or active chest pain, there is no clinical evidence for ACS.  Patient's BNP is also elevated but it is improved from prior and his chest x-ray shows no evidence of acute CHF.  His potassium is borderline low.    The patient is still pending a UA.  I am signing the patient out to the oncoming physician Dr. Lamont Snowball.  If patient has a UTI, it would be reasonable to admit and then have social work see the patient in the hospital.  If UA is negative and patient remains clinically stable, then the plan will be for social work evaluation in the morning for possible placement back to a nursing home.  ____________________________________________   FINAL CLINICAL IMPRESSION(S) / ED DIAGNOSES  Final diagnoses:  Atypical chest pain      NEW MEDICATIONS STARTED DURING THIS VISIT:  New Prescriptions   No  medications on file     Note:  This document was prepared using Dragon voice recognition software and may include unintentional dictation errors.    Dionne Bucy, MD 08/12/17 [phone]

## 2017-08-12 NOTE — ED Notes (Signed)
Xray at the bedside.

## 2017-08-12 NOTE — ED Triage Notes (Signed)
Pt arrived via [facility] from home with c/o chest pain. Per EMS report, the patient was recently discharged from [facility] facility and decided to stop taking all of his medications and has been having chest pain for about 3 weeks and worsening upon arrival today. Pt reports non-radiating, left side chest pain and pressure at 6/10. Pt denies any shortness of breath, nausea, vomiting or headache.

## 2017-08-13 DIAGNOSIS — R0789 Other chest pain: Secondary | ICD-10-CM | POA: Diagnosis not present

## 2017-08-13 LAB — URINALYSIS, COMPLETE (UACMP) WITH MICROSCOPIC
Bacteria, UA: NONE SEEN
Bilirubin Urine: NEGATIVE
Glucose, UA: NEGATIVE mg/dL
KETONES UR: NEGATIVE mg/dL
Leukocytes, UA: NEGATIVE
Nitrite: NEGATIVE
PROTEIN: NEGATIVE mg/dL
SQUAMOUS EPITHELIAL / LPF: NONE SEEN (ref 0–5)
Specific Gravity, Urine: 1.021 (ref 1.005–1.030)
pH: 6 (ref 5.0–8.0)

## 2017-08-13 LAB — TROPONIN I: TROPONIN I: 0.04 ng/mL — AB (ref <0.03)

## 2017-08-13 NOTE — ED Notes (Signed)
Pt assisted to standing to use urinal. Pt steady with no distress noted.

## 2017-08-13 NOTE — Clinical Social Work Note (Signed)
CSW spoke with patient he was wanting to go to a SNF, and would like to return to [facility].  CSW contacted [facility] and per administrator Christiane Ha they stated they would not take patient back because [facility] DSS is taking too long to convert Medicaid to long term care medicaid.  CSW spoke to [facility] and they said patient has been coming to SNF asking to be placed back at facility, but he needed an FL2.  CSW completed FL2 and sent it to [facility], however they still can not accept him from ED.  CSW was also informed by patient that patient's social security checks were still going to [facility] even though he has not been staying there anymore.  CSW contacted [facility] who confirmed they were still coming to SNF because patient did not notify social security about his new address.  [facility] stated that the checks will be sent back to the social security office.  CSW contacted the social security office for patient so he could discuss having the address changed, and social security office said he will have to go to the [employer] office in person with a photo ID to get the address changed.  Patient is expressed that he will try to get to office on his own.  CSW also spoke with patient in regards to home health social worker assisting with helping him find an ALF or SNF for him to go to.  Patient is agreeable to having home health assist him with placement.  Patient did not express any other questions, CSW to sign off, patient will be transported home via EMS.  Ervin Knack. Mirah Nevins, MSW, Theresia Majors [phone]  08/13/2017 5:54 PM

## 2017-08-13 NOTE — ED Notes (Signed)
Ambulated pt to toilet x1 assist.

## 2017-08-13 NOTE — Discharge Instructions (Signed)
Return to the emergency department for severe pain, shortness of breath, or any other symptoms concerning to you.

## 2017-08-13 NOTE — NC FL2 (Addendum)
[place] MEDICAID FL2 LEVEL OF CARE SCREENING TOOL     IDENTIFICATION  Patient Name: Danny [name] Birthdate: 08-26-1950 Sex: male Admission Date (Current Location): 08/12/2017  [place] and [place] Number:  [address] [id number] [facility] Facility and Address:  [facility], [address], [place], [place] [address]      Provider Number: [id number]  Attending Physician Name and Address:  Rockne Menghini,  MD  Relative Name and Phone Number:  Carlynn Purl [phone]     Current Level of Care: Hospital Recommended Level of Care: Skilled Nursing Facility Prior Approval Number:    Date Approved/Denied:   PASRR Number: [id number]  Discharge Plan: SNF    Current Diagnoses: Patient Active Problem List   Diagnosis Date Noted  . Atrial fibrillation (HCC) 03/05/2017  . Leg wound, right 03/05/2017  . Abnormal LFTs   . DNR (do not resuscitate)   . Palliative care by specialist   . CHF (congestive heart failure) (HCC) 02/21/2017  . Diabetes (HCC) 10/17/2014  . Hypertension 09/06/2014  . Cardiomyopathy (HCC) 09/06/2014  . COPD (chronic obstructive pulmonary disease) (HCC) 02/02/2013    Orientation RESPIRATION BLADDER Height & Weight     Self, Time, Situation, Place  Normal Continent Weight: 190 lb (86.2 kg) Height:   (177.8 cm)  BEHAVIORAL SYMPTOMS/MOOD NEUROLOGICAL BOWEL NUTRITION STATUS      Continent Diet(Regular diet)  AMBULATORY STATUS COMMUNICATION OF NEEDS Skin   Limited Assist Verbally Normal                       Personal Care Assistance Level of Assistance  Dressing, Feeding, Bathing Bathing Assistance: Limited assistance Feeding assistance: Independent Dressing Assistance: Limited assistance     Functional Limitations Info  Sight, Hearing, Speech Sight Info: Adequate Hearing Info: Adequate Speech Info: Adequate    SPECIAL CARE FACTORS FREQUENCY  PT (By licensed PT)     PT Frequency: Home Health PT minimum  2x a week.              Contractures Contractures Info: Not present    Additional Factors Info  Code Status, Allergies Code Status Info: DNR Allergies Info: Penicillins           Current Medications (08/13/2017):  This is the current hospital active medication list No current facility-administered medications for this encounter.    Current Outpatient Medications  Medication Sig Dispense Refill  . apixaban (ELIQUIS) 5 MG TABS tablet Take 1 tablet (5 mg total) by mouth 2 (two) times daily. (Patient not taking: Reported on 08/13/2017) 60 tablet 0  . digoxin (LANOXIN) 0.125 MG tablet Take 1 tablet (0.125 mg total) by mouth daily. (Patient not taking: Reported on 08/13/2017) 30 tablet 0  . furosemide (LASIX) 40 MG tablet Take 1 tablet (40 mg total) by mouth 2 (two) times daily. (Patient not taking: Reported on 08/13/2017) 30 tablet 11  . ipratropium-albuterol (DUONEB) 0.5-2.5 (3) MG/3ML SOLN Take 3 mLs by nebulization every 6 (six) hours as needed. (Patient not taking: Reported on 05/14/2017) 360 mL   . magnesium oxide (MAG-OX) 400 (241.3 Mg) MG tablet Take 1 tablet (400 mg total) by mouth daily. (Patient not taking: Reported on 08/13/2017)    . metoprolol tartrate (LOPRESSOR) 25 MG tablet Take 0.5 tablets (12.5 mg total) by mouth 2 (two) times daily. (Patient not taking: Reported on 08/13/2017)       Discharge Medications: Please see discharge summary for a list of discharge medications.  Relevant Imaging Results:  Relevant Lab Results:   Additional Information ss: [id number]  Darleene Cleaver, LCSWA

## 2017-08-13 NOTE — ED Notes (Signed)
Pt assisted to toilet in room  

## 2017-08-13 NOTE — ED Notes (Signed)
Pt wheeled in wheel chair to lobby to wait for cab. First nurse notified of this and verbalized understanding. Pt in NAD at discharge and was placed in clear view of ED entrance.

## 2017-08-13 NOTE — Care Management Note (Signed)
Case Management Note  Patient Details  Name: Danny [name] MRN: [id number] Date of Birth: 01-Jan-1951  Subjective/Objective:                  RNCM called to assess patient for transition of care by PT.  RNCM met with patient while in ED. He states he has no family and his girlfriend recently died in June 10, 2017. He was at [facility] for about a year and states "he was trying to make his girlfriend proud by leaving [facility] ". He claims that he did not received home health services post discharge from SNF.   He states he called social security office yesterday to learn that [facility] was still receiving his social security check as he claims he has not received.  He states he was discharged from [facility] in March 2019.  He now lives alone at [address] [name]. Contact [phone].  Per PT, patient is high fall risk and agrees that patient needs assisted living.  He states he last saw Sabino Snipes about 3 years ago over right hand injury.  The hand appears somewhat deformed/swollen at my observation.  He states his left hand feels the same but does not look like the right one.  He walks with rollator to get groceries.  He states his neighbors don't have anything to do with him- they keep to themselves.  He does not have transportation and has never applied for ACTA. He agrees to returning home with home health services however he would like to go to ALF/SNF again. He liked [facility].   Action/Plan: Home health agency choice provided- patient has no preference. I have reached out to [place] covering to determine what happened to his social security check and if he can return to [facility] care.   Expected Discharge Date:                  Expected Discharge Plan:     In-House Referral:  Clinical Social Work  Discharge planning Services  CM Consult  Post Acute Care Choice:  Home Health Choice offered to:  Patient  DME Arranged:     DME Agency:     HH Arranged:    [place] Agency:     Status of Service:  In process, will continue to follow  If discussed at Long Length of Stay Meetings, dates discussed:    Additional Comments:  Marshell Garfinkel, RN 08/13/2017, 10:56 AM

## 2017-08-13 NOTE — Evaluation (Signed)
Physical Therapy Evaluation Patient Details Name: Danny [name] MRN: [id number] DOB: [birth year] Today's Date: 08/13/2017   History of Present Illness  Patient is a 67 year old male admitted from home with chest pain. PMH includes Htn, COPD, CHF, cardiomyopathyand arrhythmia.  Clinical Impression  Pt is a 67 year old male who lives in an apartment alone.  He reports being independent with use of RW at baseline.  Pt is in bed upon PT arrival and reports still feeling some pain in L UE.  Pt requires mod A to get to EOB and VC's for foot placement and upright posture when sitting at EOB.  Pt presented with strength deficits of UE/LE with R side being weaker than L as well as limited shoulder ROM.  Pt required CGA to stand from elevated bedside due to unsteadiness on feet and pt reporting feeling "swimmy headed".  BP was checked an WNL.  Pt able to ambulate 50 ft with RW with frequent VC's for safe management of RW.  Pt presented with gait deviations indicative of high fall risk and hit the L side of the RW with his L foot several times.  Pt was able to follow commands but returned to previous gait pattern when fatigued.  When asked to perform static functional activity requiring balance, pt required min A to avoid anterior LOB.   Pt will benefit from skilled PT with focus on balance and fall prevention, safe use of AD, strength and shoulder ROM.  Due to high fall risk and pt not having a caregiver available, pt will be appropriate for ALF and home health PT.    Follow Up Recommendations Home health PT;Supervision for mobility/OOB;Other (comment)(Pt is interested in, and appropriate for, ALF at this time.)    Equipment Recommendations       Recommendations for Other Services       Precautions / Restrictions Precautions Precautions: Fall Restrictions Weight Bearing Restrictions: No      Mobility  Bed Mobility Overal bed mobility: Needs Assistance Bed Mobility: Supine to Sit     Supine to  sit: Min assist     General bed mobility comments: Required hand held assist to sit at EOB and VC's for placement of feet on floor for support.  Transfers Overall transfer level: Needs assistance Equipment used: Rolling walker (2 wheeled) Transfers: Sit to/from Stand Sit to Stand: Min guard         General transfer comment: Pt able to stand from elevated bed but unsteady on feet.  Requires VC's for proper use of RW.  Ambulation/Gait Ambulation/Gait assistance: Supervision Ambulation Distance (Feet): 50 Feet Assistive device: Rolling walker (2 wheeled)     Gait velocity interpretation: <1.8 ft/sec, indicate of risk for recurrent falls General Gait Details: Multiple VC's for safe use of RW as pt frequently hits RW with L foot.  PT discussed safe use of RW.  Pt presents with a slow gait, low foot clearance and fatigue at 25 ft.  Pt is able to correct briefly following VC's but returns to previous gait pattern when fatigued.  Stairs            Wheelchair Mobility    Modified Rankin (Stroke Patients Only)       Balance Overall balance assessment: Needs assistance Sitting-balance support: Bilateral upper extremity supported;Feet supported       Standing balance support: Bilateral upper extremity supported   Standing balance comment: Requires unilateral to bilateral UE assist.  When asked to pick object up from  floor, pt used counter for support and required min A to avoid losing balance anteriorly.             High level balance activites: Direction changes;Side stepping;Backward walking High Level Balance Comments: Requires slowed gait and VC's for safe management of RW.  Pt able to correct and demonstrate safe turns with RW but requires reminders throughout evaluation.             Pertinent Vitals/Pain Pain Assessment: Faces Faces Pain Scale: Hurts a little bit Pain Location: Reports pain in L upper arm and asks to have BP cuff placed on R arm Pain  Intervention(s): Limited activity within patient's tolerance    Home Living Family/patient expects to be discharged to:: Assisted living               Home Equipment: Walker - 4 wheels Additional Comments: Pt states that he has a walker with wheels which he can sit on to take breaks when needed.    Prior Function Level of Independence: Independent with assistive device(s)         Comments: Pt has been walking 0.5 mi to grocery shop and carries groceries in the basket on his walker.  Pt is concerned about mobility and ability to care for himself and obtain groceries as he does not have anyone nearby to care for him.     Hand Dominance        Extremity/Trunk Assessment   Upper Extremity Assessment Upper Extremity Assessment: Generalized weakness(RUE grossly 3+/5, L UE grossly 4-/5.  R shoulder abduction ROM limited by pain to 70 degrees.)    Lower Extremity Assessment Lower Extremity Assessment: Generalized weakness    Cervical / Trunk Assessment Cervical / Trunk Assessment: Kyphotic  Communication   Communication: No difficulties  Cognition Arousal/Alertness: Awake/alert Behavior During Therapy: WFL for tasks assessed/performed Overall Cognitive Status: Within Functional Limits for tasks assessed                                 General Comments: A&O x4      General Comments      Exercises     Assessment/Plan    PT Assessment Patient needs continued PT services  PT Problem List Decreased strength;Decreased activity tolerance;Decreased balance       PT Treatment Interventions DME instruction;Balance training;Gait training;Neuromuscular re-education;Stair training;Functional mobility training;Therapeutic activities;Therapeutic exercise;Patient/family education    PT Goals (Current goals can be found in the Care Plan section)  Acute Rehab PT Goals Patient Stated Goal: To continue to be able to walk as much as possible.  To live somewhere  where he can receive help with ADL's. PT Goal Formulation: With patient Time For Goal Achievement: 08/27/17 Potential to Achieve Goals: Good    Frequency Min 2X/week   Barriers to discharge        Co-evaluation               AM-PAC PT "6 Clicks" Daily Activity  Outcome Measure Difficulty turning over in bed (including adjusting bedclothes, sheets and blankets)?: A Little Difficulty moving from lying on back to sitting on the side of the bed? : A Lot Difficulty sitting down on and standing up from a chair with arms (e.g., wheelchair, bedside commode, etc,.)?: A Little Help needed moving to and from a bed to chair (including a wheelchair)?: A Little Help needed walking in hospital room?: A Little Help needed climbing 3-5 steps with  a railing? : A Little 6 Click Score: 17    End of Session Equipment Utilized During Treatment: Gait belt Activity Tolerance: Patient limited by fatigue Patient left: in bed;with nursing/sitter in room   PT Visit Diagnosis: Unsteadiness on feet (R26.81);Repeated falls (R29.6)    Time: 1000-1025 PT Time Calculation (min) (ACUTE ONLY): 25 min   Charges:   PT Evaluation $PT Eval Low Complexity: 1 Low PT Treatments $Gait Training: 8-22 mins   PT G Codes:   PT G-Codes **NOT FOR INPATIENT CLASS** Functional Assessment Tool Used: AM-PAC 6 Clicks Basic Mobility    Glenetta Hew, PT, DPT   Glenetta Hew 08/13/2017, 10:59 AM

## 2017-08-13 NOTE — Care Management (Signed)
Patient will return to home today by EMS as he has not other transportation access. I have notified [facility] of need and patient request for long-term care placement.

## 2017-11-02 ENCOUNTER — Other Ambulatory Visit: Payer: Self-pay

## 2017-11-02 ENCOUNTER — Emergency Department: Payer: Medicare Other

## 2017-11-02 ENCOUNTER — Emergency Department
Admission: EM | Admit: 2017-11-02 | Discharge: 2017-11-03 | Disposition: A | Discharge status: Home / Self Care | Payer: Medicare Other | Attending: Emergency Medicine | Admitting: Emergency Medicine

## 2017-11-02 DIAGNOSIS — R0789 Other chest pain: Secondary | ICD-10-CM | POA: Diagnosis not present

## 2017-11-02 DIAGNOSIS — R42 Dizziness and giddiness: Secondary | ICD-10-CM | POA: Insufficient documentation

## 2017-11-02 DIAGNOSIS — Z87891 Personal history of nicotine dependence: Secondary | ICD-10-CM | POA: Insufficient documentation

## 2017-11-02 DIAGNOSIS — R7989 Other specified abnormal findings of blood chemistry: Secondary | ICD-10-CM

## 2017-11-02 DIAGNOSIS — R778 Other specified abnormalities of plasma proteins: Secondary | ICD-10-CM

## 2017-11-02 DIAGNOSIS — E119 Type 2 diabetes mellitus without complications: Secondary | ICD-10-CM | POA: Diagnosis not present

## 2017-11-02 DIAGNOSIS — R748 Abnormal levels of other serum enzymes: Secondary | ICD-10-CM | POA: Insufficient documentation

## 2017-11-02 DIAGNOSIS — I110 Hypertensive heart disease with heart failure: Secondary | ICD-10-CM | POA: Diagnosis not present

## 2017-11-02 DIAGNOSIS — R441 Visual hallucinations: Secondary | ICD-10-CM | POA: Diagnosis not present

## 2017-11-02 DIAGNOSIS — N390 Urinary tract infection, site not specified: Secondary | ICD-10-CM | POA: Diagnosis not present

## 2017-11-02 DIAGNOSIS — F0390 Unspecified dementia without behavioral disturbance: Secondary | ICD-10-CM

## 2017-11-02 DIAGNOSIS — Z7901 Long term (current) use of anticoagulants: Secondary | ICD-10-CM | POA: Diagnosis not present

## 2017-11-02 DIAGNOSIS — I509 Heart failure, unspecified: Secondary | ICD-10-CM | POA: Insufficient documentation

## 2017-11-02 DIAGNOSIS — E538 Deficiency of other specified B group vitamins: Secondary | ICD-10-CM

## 2017-11-02 DIAGNOSIS — Z79899 Other long term (current) drug therapy: Secondary | ICD-10-CM | POA: Diagnosis not present

## 2017-11-02 DIAGNOSIS — I10 Essential (primary) hypertension: Secondary | ICD-10-CM | POA: Diagnosis present

## 2017-11-02 DIAGNOSIS — F03A0 Unspecified dementia, mild, without behavioral disturbance, psychotic disturbance, mood disturbance, and anxiety: Secondary | ICD-10-CM

## 2017-11-02 DIAGNOSIS — J449 Chronic obstructive pulmonary disease, unspecified: Secondary | ICD-10-CM | POA: Diagnosis not present

## 2017-11-02 LAB — SALICYLATE LEVEL: Salicylate Lvl: <7 mg/dL (ref 2.8–30.0)

## 2017-11-02 LAB — COMPREHENSIVE METABOLIC PANEL
ALBUMIN: 4.5 g/dL (ref 3.5–5.0)
ALK PHOS: 50 U/L (ref 38–126)
ALT: 15 U/L (ref 0–44)
AST: 21 U/L (ref 15–41)
Anion gap: 11 (ref 5–15)
BILIRUBIN TOTAL: 2.4 mg/dL — AB (ref 0.3–1.2)
BUN: 11 mg/dL (ref 8–23)
CO2: 26 mmol/L (ref 22–32)
Calcium: 8.6 mg/dL — ABNORMAL LOW (ref 8.9–10.3)
Chloride: 108 mmol/L (ref 98–111)
Creatinine, Ser: 1.21 mg/dL (ref 0.61–1.24)
GFR calc Af Amer: >60 mL/min (ref >60)
GFR calc non Af Amer: >60 mL/min (ref >60)
Glucose, Bld: 131 mg/dL — ABNORMAL HIGH (ref 70–99)
POTASSIUM: 3.4 mmol/L — AB (ref 3.5–5.1)
Sodium: 145 mmol/L (ref 135–145)
TOTAL PROTEIN: 7.4 g/dL (ref 6.5–8.1)

## 2017-11-02 LAB — URINE DRUG SCREEN, QUALITATIVE (ARMC ONLY)
Amphetamines, Ur Screen: NOT DETECTED
BARBITURATES, UR SCREEN: NOT DETECTED
Benzodiazepine, Ur Scrn: NOT DETECTED
CANNABINOID 50 NG, UR ~~LOC~~: NOT DETECTED
COCAINE METABOLITE, UR ~~LOC~~: NOT DETECTED
MDMA (Ecstasy)Ur Screen: NOT DETECTED
Methadone Scn, Ur: NOT DETECTED
OPIATE, UR SCREEN: NOT DETECTED
PHENCYCLIDINE (PCP) UR S: NOT DETECTED
TRICYCLIC, UR SCREEN: NOT DETECTED

## 2017-11-02 LAB — CBC
HEMATOCRIT: 37.4 % — AB (ref 40.0–52.0)
HEMOGLOBIN: 12.9 g/dL — AB (ref 13.0–18.0)
MCH: 30.6 pg (ref 26.0–34.0)
MCHC: 34.5 g/dL (ref 32.0–36.0)
MCV: 88.6 fL (ref 80.0–100.0)
Platelets: 200 10*3/uL (ref 150–440)
RBC: 4.22 MIL/uL — ABNORMAL LOW (ref 4.40–5.90)
RDW: 15.1 % — ABNORMAL HIGH (ref 11.5–14.5)
WBC: 7.4 10*3/uL (ref 3.8–10.6)

## 2017-11-02 LAB — ACETAMINOPHEN LEVEL: Acetaminophen (Tylenol), Serum: <10 ug/mL — ABNORMAL LOW (ref 10–30)

## 2017-11-02 LAB — ETHANOL: Alcohol, Ethyl (B): <10 mg/dL (ref <10)

## 2017-11-02 NOTE — ED Notes (Signed)
Pt belongings: 1 tan hat, 1 white t-shirt, 1 pr gray shoes, 1 pr black socks, 1 black [employer] flip-style cell phone, 2 keys, 1 rolling walker, 1 pr gray sweat pants, 1 pr gray underwear. Cash counted and verified with Maralyn SagoSarah, EDT: (3) $100's, (19) $20's, (4) $10's, (5) $5's, and (1) $1.

## 2017-11-02 NOTE — ED Notes (Signed)
Patient transported to CT 

## 2017-11-02 NOTE — ED Notes (Signed)
Per pt, call Danny [name], [phone].  Neighbor and friend who brought him.  If discharged, call her for piuck-up

## 2017-11-02 NOTE — ED Notes (Signed)
TTS at bedside. 

## 2017-11-02 NOTE — BH Assessment (Signed)
Assessment Note  Danny [name] is an 67 y.o. male. Mr. Danny [name] arrived to the ED by way of personal transportation by a friend who lives in his boarding house (he could not recall her name). He states that "I am seeing thing for about a week and a half".  He states that he sees things moving that should not be moving.  He states that he sees things going across the ceiling in shadows.  He shared that he sees things that come to life that should not be, like a car with a whirlwind. He reports that the pictuers on the wall will lean back and forth to see him.  He is seeing cords moving up and down, and he is seeing cats in his room that appear and disappear.  He states that he is lonely and he lives a lonely life since his girl-friend died back in February.  He states that he has seen a large man in his home fixing drugs and he does not look right and things come out of the band on his head.  He denied symptoms of anxiety and depression. He denied suicidal or homicidal ideation or intent.  He denied the use of alcohol or drugs.   Diagnosis: Hallucinations  Past Medical History:  Past Medical History:  Diagnosis Date  . Arrhythmia    atrial fib  . Cardiomyopathy (HCC)   . CHF (congestive heart failure) (HCC)   . COPD (chronic obstructive pulmonary disease) (HCC)   . Hypertension     Past Surgical History:  Procedure Laterality Date  . HERNIA REPAIR     x 3  . WRIST FRACTURE SURGERY Right 05/2016    Family History: No family history on file.  Social History:  reports that he quit smoking about 13 years ago. His smoking use included cigarettes. He has never used smokeless tobacco. He reports that he drinks alcohol. He reports that he does not use drugs.  Additional Social History:  Alcohol / Drug Use History of alcohol / drug use?: No history of alcohol / drug abuse  CIWA: CIWA-Ar BP: (!) 148/100 Pulse Rate: 95 COWS:    Allergies:  Allergies  Allergen Reactions  . Penicillins  Rash    Has patient had a PCN reaction causing immediate rash, facial/tongue/throat swelling, SOB or lightheadedness with hypotension: No Has patient had a PCN reaction causing severe rash involving mucus membranes or skin necrosis: No Has patient had a PCN reaction that required hospitalization: No Has patient had a PCN reaction occurring within the last 10 years: No If all of the above answers are "NO", then may proceed with Cephalosporin use.     Home Medications:  (Not in a hospital admission)  OB/GYN Status:  No LMP for male patient.  General Assessment Data [facility] Assessment: In system Is this a Tele or Face-to-Face Assessment?: Face-to-Face Is this an Initial Assessment or a Re-assessment for this encounter?: Initial Assessment Marital status: Single Maiden name: Danny [name] Is patient pregnant?: No Pregnancy Status: No Living Arrangements: Alone Can pt return to current living arrangement?: Yes Admission Status: Voluntary Is patient capable of signing voluntary admission?: Yes Referral Source: Self/Family/Friend Insurance type: Medicare/Medicaid  Medical Screening Exam [facility] ONLY) Medical Exam completed: Yes  Crisis Care Plan Living Arrangements: Alone Legal Guardian: Other:(Self) Name of Psychiatrist: None Name of Therapist: None  Education Status Is patient currently in school?: No Is the patient employed, unemployed or receiving disability?: Unemployed  Risk to  self with the past 6 months Suicidal Ideation: No Has patient been a risk to self within the past 6 months prior to admission? : No Suicidal Intent: No Has patient had any suicidal intent within the past 6 months prior to admission? : No Is patient at risk for suicide?: No Suicidal Plan?: No Has patient had any suicidal plan within the past 6 months prior to admission? : No Access to Means: No What has been your use of drugs/alcohol within the last 12 months?: Denied  use Previous Attempts/Gestures: No How many times?: 0 Other Self Harm Risks: denied Triggers for Past Attempts: None known Intentional Self Injurious Behavior: None Family Suicide History: No(Sister made attempt, but was unsuccessful) Recent stressful life event(s): Other (Comment)(Death of girlfriend in 06/06/2022) Persecutory voices/beliefs?: No Depression: No Depression Symptoms: (Lonely) Substance abuse history and/or treatment for substance abuse?: No Suicide prevention information given to non-admitted patients: Not applicable  Risk to Others within the past 6 months Homicidal Ideation: No Does patient have any lifetime risk of violence toward others beyond the six months prior to admission? : No Thoughts of Harm to Others: No Current Homicidal Intent: No Current Homicidal Plan: No Access to Homicidal Means: No Identified Victim: None identified History of harm to others?: No Assessment of Violence: None Noted Violent Behavior Description: denied Does patient have access to weapons?: No Criminal Charges Pending?: No Does patient have a court date: No Is patient on probation?: No  Psychosis Hallucinations: Visual Delusions: None noted  Mental Status Report Appearance/Hygiene: In scrubs Eye Contact: Fair Motor Activity: Unremarkable Speech: Tangential(Stuttered) Level of Consciousness: Alert Mood: Pleasant Affect: Appropriate to circumstance Anxiety Level: None Judgement: Partial Orientation: Place, Situation, Person Obsessive Compulsive Thoughts/Behaviors: None  Cognitive Functioning Concentration: Poor Memory: Recent Intact Is patient IDD: No Is patient DD?: No Insight: Fair Impulse Control: Fair Appetite: Poor Have you had any weight changes? : No Change Sleep: Decreased Vegetative Symptoms: None  ADLScreening [facility] Assessment Services) Patient's cognitive ability adequate to safely complete daily activities?: Yes Patient able to express need for  assistance with ADLs?: Yes Independently performs ADLs?: Yes (appropriate for developmental age)  Prior Inpatient Therapy Prior Inpatient Therapy: No  Prior Outpatient Therapy Prior Outpatient Therapy: No Does patient have an ACCT team?: No Does patient have Intensive In-House Services?  : No Does patient have [facility] services? : No Does patient have P4CC services?: No  ADL Screening (condition at time of admission) Patient's cognitive ability adequate to safely complete daily activities?: Yes Is the patient deaf or have difficulty hearing?: No Does the patient have difficulty seeing, even when wearing glasses/contacts?: No Does the patient have difficulty concentrating, remembering, or making decisions?: No Patient able to express need for assistance with ADLs?: Yes Does the patient have difficulty dressing or bathing?: No Independently performs ADLs?: Yes (appropriate for developmental age) Does the patient have difficulty walking or climbing stairs?: Yes Weakness of Legs: Left Weakness of Arms/Hands: None  Home Assistive Devices/Equipment Home Assistive Devices/Equipment: None    Abuse/Neglect Assessment (Assessment to be complete while patient is alone) Abuse/Neglect Assessment Can Be Completed: (Denied history of abuse)     Advance Directives (For Healthcare) Does Patient Have a Medical Advance Directive?: No          Disposition:  Disposition Initial Assessment Completed for this Encounter: Yes  On Site Evaluation by:   Reviewed with Physician:    Justice Deeds 11/02/2017 11:04 PM

## 2017-11-02 NOTE — ED Triage Notes (Signed)
Pt arrives to ED via POV from home with c/o hallucinations x1 1/2 weeks. Pt reports seeing "shadows, animals, and people on the ceiling". Pt denies any thoughts of SI or HI. Pt has never seen a psychiatrist, does not take psych medications. Pt is calm and cooperative, denies drug or ETOH use.

## 2017-11-02 NOTE — ED Provider Notes (Signed)
[facility] Emergency Department Provider Note ____________________________________________   First MD Initiated Contact with Patient 11/02/17 2222     (approximate)  I have reviewed the triage vital signs and the nursing notes.   HISTORY  Chief Complaint Hallucinations    HPI Danny [name] is a 67 y.o. male with PMH as noted below who presents with hallucinations, gradual onset over the last 1 to 2 weeks, and consisting primarily of visual hallucinations.  The patient states that he sees things moving that are not actually moving, people in the ceiling, and is also sometimes seen things that are not there at all like an visible car on the street which she was able to see because of the dust kicked up but he could not see the car itself.  He denies any prior history of anything like this.  He denies auditory hallucinations or other acute psychiatric symptoms.  The patient denies SI or HI, but he does say that he is very lonely and asks if that the symptoms could be caused by loneliness.  The only medical complaint that the patient endorses is some tightness in his chest, as well as some mild dizziness although he says that these have not changed recently.  He states that he does not take any medications.   Past Medical History:  Diagnosis Date  . Arrhythmia    atrial fib  . Cardiomyopathy (HCC)   . CHF (congestive heart failure) (HCC)   . COPD (chronic obstructive pulmonary disease) (HCC)   . Hypertension     Patient Active Problem List   Diagnosis Date Noted  . Atrial fibrillation (HCC) 03/05/2017  . Leg wound, right 03/05/2017  . Abnormal LFTs   . DNR (do not resuscitate)   . Palliative care by specialist   . CHF (congestive heart failure) (HCC) 02/21/2017  . Diabetes (HCC) 10/17/2014  . Hypertension 09/06/2014  . Cardiomyopathy (HCC) 09/06/2014  . COPD (chronic obstructive pulmonary disease) (HCC) 02/02/2013    Past Surgical History:    Procedure Laterality Date  . HERNIA REPAIR     x 3  . WRIST FRACTURE SURGERY Right 05/2016    Prior to Admission medications   Medication Sig Start Date End Date Taking? Authorizing Provider  apixaban (ELIQUIS) 5 MG TABS tablet Take 1 tablet (5 mg total) by mouth 2 (two) times daily. Patient not taking: Reported on 08/13/2017 02/27/17   Milagros Loll, MD  digoxin (LANOXIN) 0.125 MG tablet Take 1 tablet (0.125 mg total) by mouth daily. Patient not taking: Reported on 08/13/2017 02/27/17   Milagros Loll, MD  furosemide (LASIX) 40 MG tablet Take 1 tablet (40 mg total) by mouth 2 (two) times daily. Patient not taking: Reported on 08/13/2017 02/27/17 02/27/18  Milagros Loll, MD  ipratropium-albuterol (DUONEB) 0.5-2.5 (3) MG/3ML SOLN Take 3 mLs by nebulization every 6 (six) hours as needed. Patient not taking: Reported on 05/14/2017 02/27/17   Milagros Loll, MD  magnesium oxide (MAG-OX) 400 (241.3 Mg) MG tablet Take 1 tablet (400 mg total) by mouth daily. Patient not taking: Reported on 08/13/2017 02/28/17   Milagros Loll, MD  metoprolol tartrate (LOPRESSOR) 25 MG tablet Take 0.5 tablets (12.5 mg total) by mouth 2 (two) times daily. Patient not taking: Reported on 08/13/2017 02/27/17   Milagros Loll, MD    Allergies Penicillins  No family history on file.  Social History Social History   Tobacco Use  . Smoking status: Former Smoker    Types: Cigarettes  Last attempt to quit: 10/05/2004    Years since quitting: 13.0  . Smokeless tobacco: Never Used  Substance Use Topics  . Alcohol use: Yes    Comment: 8-12beers/day   . Drug use: No    Review of Systems  Constitutional: No fever. Eyes: No visual changes. ENT: No sore throat. Cardiovascular: Denies chest pain. Respiratory: Denies shortness of breath. Gastrointestinal: No vomiting. Genitourinary: Negative for dysuria.  Musculoskeletal: Negative for back pain. Skin: Negative for rash. Neurological: Negative for  headache.   ____________________________________________   PHYSICAL EXAM:  VITAL SIGNS: ED Triage Vitals  Enc Vitals Group     BP 11/02/17 2112 (!) 148/100     Pulse Rate 11/02/17 2112 95     Resp 11/02/17 2112 20     Temp 11/02/17 2112 98.2 F (36.8 C)     Temp Source 11/02/17 2112 Oral     SpO2 11/02/17 2112 96 %     Weight 11/02/17 2113 190 lb (86.2 kg)     Height 11/02/17 2113 5\' 10"  (1.778 m)     Head Circumference --      Peak Flow --      Pain Score 11/02/17 2123 0     Pain Loc --      Pain Edu? --      Excl. in GC? --     Constitutional: Alert and oriented.  Relatively comfortable appearing and in no acute distress. Eyes: Conjunctivae are normal.  EOMI.  PERRLA. Head: Atraumatic. Nose: No congestion/rhinnorhea. Mouth/Throat: Mucous membranes are moist.   Neck: Normal range of motion.  Cardiovascular: Normal rate, regular rhythm. Grossly normal heart sounds.  Good peripheral circulation. Respiratory: Normal respiratory effort.  No retractions. Lungs CTAB. Gastrointestinal: No distention.  Musculoskeletal: No lower extremity edema.  Extremities warm and well perfused.  Neurologic:  Normal speech and language. No gross focal neurologic deficits are appreciated.  Skin:  Skin is warm and dry. No rash noted. Psychiatric: Mood and affect are normal. Speech and behavior are normal.  ____________________________________________   LABS (all labs ordered are listed, but only abnormal results are displayed)  Labs Reviewed  COMPREHENSIVE METABOLIC PANEL - Abnormal; Notable for the following components:      Result Value   Potassium 3.4 (*)    Glucose, Bld 131 (*)    Calcium 8.6 (*)    Total Bilirubin 2.4 (*)    All other components within normal limits  CBC - Abnormal; Notable for the following components:   RBC 4.22 (*)    Hemoglobin 12.9 (*)    HCT 37.4 (*)    RDW 15.1 (*)    All other components within normal limits  ACETAMINOPHEN LEVEL - Abnormal; Notable  for the following components:   Acetaminophen (Tylenol), Serum <10 (*)    All other components within normal limits  ETHANOL  URINE DRUG SCREEN, QUALITATIVE ([facility] ONLY)  SALICYLATE LEVEL  TSH  VITAMIN B12  URINALYSIS, COMPLETE (UACMP) WITH MICROSCOPIC  TROPONIN I   ____________________________________________  EKG   ____________________________________________  RADIOLOGY  CT head: Pending CXR: Pending  ____________________________________________   PROCEDURES  Procedure(s) performed: No  Procedures  Critical Care performed: No ____________________________________________   INITIAL IMPRESSION / ASSESSMENT AND PLAN / ED COURSE  Pertinent labs & imaging results that were available during my care of the patient were reviewed by me and considered in my medical decision making (see chart for details).  67 year old male with PMH as noted above presents with mostly visual hallucinations, seeing things  that he knows are not there, and also reporting feeling increasingly lonely.  He denies depression, SI, or HI.  He denies any focal medical complaints other than some chronic chest tightness.  I reviewed the past medical records in Epic; the patient was most recently seen in the ED 2 months ago with chest discomfort.  At that time he reported being discharged from a nursing home and had stopped taking all of his medications.  He was evaluated by social work and ultimately sent home.  On exam, the vital signs are normal, the patient is alert and comfortable appearing.  He answers all my questions appropriately, does not appear acutely altered.  The remainder of the exam is as described above.  Overall, visual hallucinations, especially with insight that they are not real, would be more consistent with organic cause, rather than psychiatric cause.  However the patient also describes some more elaborate hallucinations/delusions such as the car which is not visible.  He also endorses  loneliness and asks if this could be related, so I suspect that there is some underlying psychiatric element to his presentation.  We will obtain CT head,  lab work-up for altered mental status, as well [facility].  If medically cleared, the disposition will be per psychiatry recommendations.  ----------------------------------------- 11:17 PM on 11/02/2017 -----------------------------------------  Patient is pending CT head, chest x-ray, and some of the lab work-up as well as SOC evaluation.  I am signing the patient out to the oncoming physician, Dr. York CeriseForbach. ____________________________________________   FINAL CLINICAL IMPRESSION(S) / ED DIAGNOSES  Final diagnoses:  Visual hallucinations      NEW MEDICATIONS STARTED DURING THIS VISIT:  New Prescriptions   No medications on file     Note:  This document was prepared using Dragon voice recognition software and may include unintentional dictation errors.    Dionne BucySiadecki, Mishika Flippen, MD 11/02/17 [phone]

## 2017-11-02 NOTE — ED Notes (Signed)
Patient back from CT.

## 2017-11-03 DIAGNOSIS — R441 Visual hallucinations: Secondary | ICD-10-CM | POA: Diagnosis not present

## 2017-11-03 DIAGNOSIS — F0390 Unspecified dementia without behavioral disturbance: Secondary | ICD-10-CM

## 2017-11-03 DIAGNOSIS — F03A0 Unspecified dementia, mild, without behavioral disturbance, psychotic disturbance, mood disturbance, and anxiety: Secondary | ICD-10-CM

## 2017-11-03 LAB — URINALYSIS, COMPLETE (UACMP) WITH MICROSCOPIC
Glucose, UA: NEGATIVE mg/dL
KETONES UR: NEGATIVE mg/dL
NITRITE: NEGATIVE
PH: 5 (ref 5.0–8.0)
PROTEIN: 100 mg/dL — AB
Specific Gravity, Urine: 1.03 (ref 1.005–1.030)
WBC, UA: >50 WBC/hpf — ABNORMAL HIGH (ref 0–5)

## 2017-11-03 LAB — TROPONIN I
TROPONIN I: 0.06 ng/mL — AB (ref <0.03)
TROPONIN I: 0.06 ng/mL — AB (ref <0.03)
Troponin I: 0.05 ng/mL (ref <0.03)

## 2017-11-03 LAB — VITAMIN B12: Vitamin B-12: 103 pg/mL — ABNORMAL LOW (ref 180–914)

## 2017-11-03 LAB — TSH: TSH: 2.013 u[IU]/mL (ref 0.350–4.500)

## 2017-11-03 LAB — DIGOXIN LEVEL: Digoxin Level: <0.2 ng/mL — ABNORMAL LOW (ref 0.8–2.0)

## 2017-11-03 MED ORDER — CYANOCOBALAMIN 1000 MCG/ML IJ SOLN
1000.0000 ug | Freq: Once | INTRAMUSCULAR | Status: AC
  Administered 2017-11-03: 1000 ug via INTRAMUSCULAR
  Filled 2017-11-03: qty 1

## 2017-11-03 MED ORDER — CYANOCOBALAMIN 1000 MCG/ML IJ SOLN: 1000.0000 ug | Freq: Once | INTRAMUSCULAR | Status: DC

## 2017-11-03 MED ORDER — OLANZAPINE 5 MG PO TABS: 5.0000 mg | ORAL_TABLET | Freq: Every day | ORAL | Status: DC

## 2017-11-03 MED ORDER — CEPHALEXIN 500 MG PO CAPS
500.0000 mg | ORAL_CAPSULE | Freq: Four times a day (QID) | ORAL | Status: DC
  Administered 2017-11-03 (×2): 500 mg via ORAL
  Filled 2017-11-03 (×2): qty 1

## 2017-11-03 MED ORDER — CEPHALEXIN 500 MG PO CAPS: 500.0000 mg | ORAL_CAPSULE | Freq: Two times a day (BID) | ORAL | 0 refills | Status: AC

## 2017-11-03 MED ORDER — OLANZAPINE 5 MG PO TABS: 5.0000 mg | ORAL_TABLET | Freq: Every day | ORAL | 1 refills | Status: AC

## 2017-11-03 MED ORDER — OLANZAPINE 5 MG PO TABS
5.0000 mg | ORAL_TABLET | Freq: Every day | ORAL | Status: DC
  Administered 2017-11-03: 5 mg via ORAL

## 2017-11-03 MED ORDER — CEPHALEXIN 500 MG PO CAPS: 500.0000 mg | ORAL_CAPSULE | Freq: Once | ORAL | Status: DC

## 2017-11-03 MED ORDER — OLANZAPINE 5 MG PO TABS
ORAL_TABLET | ORAL | Status: AC
  Filled 2017-11-03: qty 1

## 2017-11-03 NOTE — ED Notes (Signed)
BEHAVIORAL HEALTH ROUNDING Patient sleeping: No. Patient alert and oriented: yes Behavior appropriate: Yes.  ; If no, describe:  Nutrition and fluids offered: yes Toileting and hygiene offered: Yes  Sitter present: q15 minute observations and security monitoring Law enforcement present: Yes    

## 2017-11-03 NOTE — ED Notes (Signed)
Lunch was given to patient by nurse Amy T. 

## 2017-11-03 NOTE — ED Notes (Signed)
Patient has home walker at bedside.

## 2017-11-03 NOTE — Discharge Instructions (Addendum)
You have a urinary tract infection.  Take the antibiotic as prescribed and finish the full 10-day course.  Your vitamin B12 level and your blood is also low.  You should follow-up with your doctor to have this rechecked by your regular doctor in 1 to 2 weeks.  Return to the ER for new, worsening, persistent weakness, fevers, vomiting, or seeing things that are not there, hearing voices, depression or any thoughts of wanting to hurt yourself, or any other symptoms that concern you.

## 2017-11-03 NOTE — ED Notes (Signed)
Pt observed lying in bed   Pt visualized with NAD  No verbalized needs or concerns at this time  Continue to monitor 

## 2017-11-03 NOTE — ED Notes (Signed)

## 2017-11-03 NOTE — ED Notes (Addendum)
Called Judeth CornfieldStephanie (patient's neighbor [phone]) to inform her that patient would be admitted.

## 2017-11-03 NOTE — ED Notes (Signed)
Writer gave report to Dr Loel RoHulkiwer [facility]).

## 2017-11-03 NOTE — ED Notes (Signed)
Patient has key in [employer] to access his cash in security safe. Key # [id number].

## 2017-11-03 NOTE — Consult Note (Signed)
[facility] Consult   Reason for Consult: Consult for this 67 year old man who came to the hospital complaining that he was having visual hallucinations Referring Physician: Quentin Cornwall Patient Identification: Danny [name] MRN:  [id number] Principal Diagnosis: Visual hallucinations Diagnosis:   Patient Active Problem List   Diagnosis Date Noted  . Visual hallucinations [R44.1] 11/03/2017  . 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 Atrial fibrillation ([place]) [I48.91] 03/05/2017  . Leg wound, right [S81.801A] 03/05/2017  . Abnormal LFTs [R94.5]   . DNR (do not resuscitate) [Z66]   . Palliative care by specialist [Z51.5]   . CHF (congestive heart failure) ([place]) [I50.9] 02/21/2017  . Diabetes ([place]) [E11.9] 10/17/2014  . Hypertension [I10] 09/06/2014  . Cardiomyopathy ([place]) [I42.9] 09/06/2014  . COPD (chronic obstructive pulmonary disease) ([place]) [J44.9] 02/02/2013    Total Time spent with patient: 1 hour  Subjective:   [name] is a 67 y.o. male patient admitted with "I just need to get my breathing straightened out".  HPI: The patient's chief complaint was as listed above.  I could not get him to clarify what he meant by that.  He did not have any other complaints about his breathing.  His chief complaint is that he has been seeing things.  It happens more in the evening than the during the day.  He describes feeling like the lamps in the electrical cords in his room are moving around.  He says at one point it looked like they were "evil".  Despite that he finds the whole thing fairly amusing.  Laughs at it and seems to have a sense that this is a odd and unreal complaint.  Patient denies any depression.  Denies any suicidal or homicidal thoughts.  Denies that he is having any visual hallucinations.  States that he is not drinking and does not use any drugs.  He claims that he is compliant with his medication although he does not have much of an understanding of them.   On further conversation the patient complains that he is chronically lonely.  He has been lonely ever since he moved out of a nursing home in February and decided to live in a rooming house on his own.  He does not particularly like it and find some of the people there to be frightening.  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  Does not seem like he has much family.  Sounds like his home life is pretty limited.  Medical history: History of atrial fibrillation diabetes high blood pressure congestive heart failure COPD.  Substance abuse history: Patient says he used to drink but stopped completely a few months ago.  Denies any drug use.    Past Psychiatric History: Patient does not know of any time that he has seen a psychiatrist in the past.  Thinks he might have taken medicine for his nerves in the past but cannot remember.  Denies ever having tried to hurt himself or any violence to others.  Nothing that I can see in his chart that would indicate a specific psychiatric history.  Risk to Self: Suicidal Ideation: No Suicidal Intent: No Is patient at risk for suicide?: No Suicidal Plan?: No Access to Means: No What has been your use of drugs/alcohol within the last 12 months?: Denied use How many times?: 0 Other Self Harm Risks: denied Triggers for Past Attempts: None known Intentional Self Injurious Behavior: None Risk to Others: Homicidal Ideation: No Thoughts of  Harm to Others: No Current Homicidal Intent: No Current Homicidal Plan: No Access to Homicidal Means: No Identified Victim: None identified History of harm to others?: No Assessment of Violence: None Noted Violent Behavior Description: denied Does patient have access to weapons?: No Criminal Charges Pending?: No Does patient have a court date: No Prior Inpatient Therapy: Prior Inpatient Therapy: No Prior Outpatient Therapy: Prior Outpatient Therapy: No Does patient have an ACCT  team?: No Does patient have Intensive In-House Services?  : No Does patient have [facility] services? : No Does patient have P4CC services?: No  Past Medical History:  Past Medical History:  Diagnosis Date  . Arrhythmia    atrial fib  . Cardiomyopathy ([place])   . CHF (congestive heart failure) ([place])   . COPD (chronic obstructive pulmonary disease) ([place])   . Hypertension     Past Surgical History:  Procedure Laterality Date  . HERNIA REPAIR     x 3  . WRIST FRACTURE SURGERY Right 05/2016   Family History: No family history on file. Family Psychiatric  History: He says he had a sister who had a drug problem Social History:  Social History   Substance and Sexual Activity  Alcohol Use Yes   Comment: 8-12beers/day      Social History   Substance and Sexual Activity  Drug Use No    Social History   Socioeconomic History  . Marital status: Single    Spouse name: Not on file  . Number of children: Not on file  . Years of education: Not on file  . Highest education level: Not on file  Occupational History  . Not on file  Social Needs  . Financial resource strain: Not on file  . Food insecurity:    Worry: Not on file    Inability: Not on file  . Transportation needs:    Medical: Not on file    Non-medical: Not on file  Tobacco Use  . Smoking status: Former Smoker    Types: Cigarettes    Last attempt to quit: 10/05/2004    Years since quitting: 13.0  . Smokeless tobacco: Never Used  Substance and Sexual Activity  . Alcohol use: Yes    Comment: 8-12beers/day   . Drug use: No  . Sexual activity: Not on file  Lifestyle  . Physical activity:    Days per week: Not on file    Minutes per session: Not on file  . Stress: Not on file  Relationships  . Social connections:    Talks on phone: Not on file    Gets together: Not on file    Attends religious service: Not on file    Active member of club or organization: Not on file    Attends meetings of clubs or  organizations: Not on file    Relationship status: Not on file  Other Topics Concern  . Not on file  Social History Narrative  . Not on file   Additional Social History:    Allergies:   Allergies  Allergen Reactions  . Penicillins Rash    Has patient had a PCN reaction causing immediate rash, facial/tongue/throat swelling, SOB or lightheadedness with hypotension: No Has patient had a PCN reaction causing severe rash involving mucus membranes or skin necrosis: No Has patient had a PCN reaction that required hospitalization: No Has patient had a PCN reaction occurring within the last 10 years: No If all of the above answers are "NO", then may proceed with Cephalosporin use.  Labs:  Results for orders placed or performed during the hospital encounter of 11/02/17 (from the past 48 hour(s))  Comprehensive metabolic panel     Status: Abnormal   Collection Time: 11/02/17  9:14 PM  Result Value Ref Range   Sodium 145 135 - 145 mmol/L   Potassium 3.4 (L) 3.5 - 5.1 mmol/L   Chloride 108 98 - 111 mmol/L   CO2 26 22 - 32 mmol/L   Glucose, Bld 131 (H) 70 - 99 mg/dL   BUN 11 8 - 23 mg/dL   Creatinine, Ser 1.21 0.61 - 1.24 mg/dL   Calcium 8.6 (L) 8.9 - 10.3 mg/dL   Total Protein 7.4 6.5 - 8.1 g/dL   Albumin 4.5 3.5 - 5.0 g/dL   AST 21 15 - 41 U/L   ALT 15 0 - 44 U/L   Alkaline Phosphatase 50 38 - 126 U/L   Total Bilirubin 2.4 (H) 0.3 - 1.2 mg/dL   GFR calc non Af Amer >60 >60 mL/min   GFR calc Af Amer >60 >60 mL/min    Comment: (NOTE) The eGFR has been calculated using the CKD EPI equation. This calculation has not been validated in all clinical situations. eGFR's persistently <60 mL/min signify possible Chronic Kidney Disease.    Anion gap 11 5 - 15    Comment: Performed at [facility], [address] [place], [place] [address]  Ethanol     Status: None   Collection Time: 11/02/17  9:14 PM  Result Value Ref Range   Alcohol, Ethyl (B) <10 <10 mg/dL    Comment:  (NOTE) Lowest detectable limit for serum alcohol is 10 mg/dL. For medical purposes only. Performed at [facility], [address] [place], [place] [address]   cbc     Status: Abnormal   Collection Time: 11/02/17  9:14 PM  Result Value Ref Range   WBC 7.4 3.8 - 10.6 K/uL   RBC 4.22 (L) 4.40 - 5.90 MIL/uL   Hemoglobin 12.9 (L) 13.0 - 18.0 g/dL   HCT 37.4 (L) 40.0 - 52.0 %   MCV 88.6 80.0 - 100.0 fL   MCH 30.6 26.0 - 34.0 pg   MCHC 34.5 32.0 - 36.0 g/dL   RDW 15.1 (H) 11.5 - 14.5 %   Platelets 200 150 - 440 K/uL    Comment: Performed at [facility], [address] [place], [place] [address]  Urine Drug Screen, Qualitative     Status: None   Collection Time: 11/02/17  9:14 PM  Result Value Ref Range   Tricyclic, Ur Screen NONE DETECTED NONE DETECTED   Amphetamines, Ur Screen NONE DETECTED NONE DETECTED   MDMA (Ecstasy)Ur Screen NONE DETECTED NONE DETECTED   Cocaine Metabolite,Ur [place] NONE DETECTED NONE DETECTED   Opiate, Ur Screen NONE DETECTED NONE DETECTED   Phencyclidine (PCP) Ur S NONE DETECTED NONE DETECTED   Cannabinoid 50 Ng, Ur [place] NONE DETECTED NONE DETECTED   Barbiturates, Ur Screen NONE DETECTED NONE DETECTED   Benzodiazepine, Ur Scrn NONE DETECTED NONE DETECTED   Methadone Scn, Ur NONE DETECTED NONE DETECTED    Comment: (NOTE) Tricyclics + metabolites, urine    Cutoff 1000 ng/mL Amphetamines + metabolites, urine  Cutoff 1000 ng/mL MDMA (Ecstasy), urine              Cutoff 500 ng/mL Cocaine Metabolite, urine          Cutoff 300 ng/mL Opiate + metabolites, urine  Cutoff 300 ng/mL Phencyclidine (PCP), urine         Cutoff 25 ng/mL Cannabinoid, urine                 Cutoff 50 ng/mL Barbiturates + metabolites, urine  Cutoff 200 ng/mL Benzodiazepine, urine              Cutoff 200 ng/mL Methadone, urine                   Cutoff 300 ng/mL The urine drug screen provides only a preliminary, unconfirmed analytical test result and should not be used  for non-medical purposes. Clinical consideration and professional judgment should be applied to any positive drug screen result due to possible interfering substances. A more specific alternate chemical method must be used in order to obtain a confirmed analytical result. Gas chromatography / mass spectrometry (GC/MS) is the preferred confirmat ory method. Performed at [facility], [address] [place], [place] [address]   Acetaminophen level     Status: Abnormal   Collection Time: 11/02/17  9:14 PM  Result Value Ref Range   Acetaminophen (Tylenol), Serum <10 (L) 10 - 30 ug/mL    Comment: (NOTE) Therapeutic concentrations vary significantly. A range of 10-30 ug/mL  may be an effective concentration for many patients. However, some  are best treated at concentrations outside of this range. Acetaminophen concentrations >150 ug/mL at 4 hours after ingestion  and >50 ug/mL at 12 hours after ingestion are often associated with  toxic reactions. Performed at [facility], [address] [place], [place] [address]   Salicylate level     Status: None   Collection Time: 11/02/17  9:14 PM  Result Value Ref Range   Salicylate Lvl <9.6 2.8 - 30.0 mg/dL    Comment: Performed at [facility], [address] [place], [place] [address]  TSH     Status: None   Collection Time: 11/02/17  9:14 PM  Result Value Ref Range   TSH 2.013 0.350 - 4.500 uIU/mL    Comment: Performed by a 3rd Generation assay with a functional sensitivity of <=0.01 uIU/mL. Performed at [facility], [address] [place], [place] [address]   Urinalysis, Complete w Microscopic     Status: Abnormal   Collection Time: 11/02/17  9:14 PM  Result Value Ref Range   Color, Urine AMBER (A) YELLOW    Comment: BIOCHEMICALS MAY BE AFFECTED BY COLOR   APPearance TURBID (A) CLEAR   Specific Gravity, Urine 1.030 1.005 - 1.030   pH 5.0 5.0 - 8.0   Glucose, UA NEGATIVE NEGATIVE mg/dL    Hgb urine dipstick SMALL (A) NEGATIVE   Bilirubin Urine MODERATE (A) NEGATIVE   Ketones, ur NEGATIVE NEGATIVE mg/dL   Protein, ur 100 (A) NEGATIVE mg/dL   Nitrite NEGATIVE NEGATIVE   Leukocytes, UA SMALL (A) NEGATIVE   RBC / HPF 6-10 0 - 5 RBC/hpf   WBC, UA >50 (H) 0 - 5 WBC/hpf   Bacteria, UA RARE (A) NONE SEEN   Squamous Epithelial / LPF 0-5 0 - 5   Mucus PRESENT    Ca Oxalate Crys, UA PRESENT    Sperm, UA PRESENT     Comment: Performed at [facility], [address] [place],  [address]  Troponin I     Status: Abnormal   Collection Time: 11/02/17  9:14 PM  Result Value Ref Range   Troponin I 0.05 (HH) <0.03 ng/mL  Comment: CRITICAL RESULT CALLED TO, READ BACK BY AND VERIFIED WITH KANISHA HERBIN _0  11/03/17 [facility] Performed at [facility], [address] [place], [place] [address]   Digoxin level     Status: Abnormal   Collection Time: 11/02/17  9:14 PM  Result Value Ref Range   Digoxin Level <0.2 (L) 0.8 - 2.0 ng/mL    Comment: Performed at [facility], [address] [place], [place] [address]  Vitamin B12     Status: Abnormal   Collection Time: 11/03/17  1:10 AM  Result Value Ref Range   Vitamin B-12 103 (L) 180 - 914 pg/mL    Comment: (NOTE) This assay is not validated for testing neonatal or myeloproliferative syndrome specimens for Vitamin B12 levels. Performed at [facility], [address], [place], [place] [address]   Troponin I     Status: Abnormal   Collection Time: 11/03/17  3:22 AM  Result Value Ref Range   Troponin I 0.06 (HH) <0.03 ng/mL    Comment: CRITICAL VALUE NOTED. VALUE IS CONSISTENT WITH PREVIOUSLY REPORTED/CALLED VALUE / [facility] Performed at [facility], [address] [place], [place] [address]   Troponin I     Status: Abnormal   Collection Time: 11/03/17  7:40 AM  Result Value Ref Range   Troponin I 0.06 (HH) <0.03 ng/mL    Comment: CRITICAL VALUE NOTED. VALUE IS CONSISTENT WITH  PREVIOUSLY REPORTED/CALLED VALUE  SDR Performed at [facility], [address] [place], [place] [address]     Current Facility-Administered Medications  Medication Dose Route Frequency Provider Last Rate Last Dose  . cephALEXin (KEFLEX) capsule 500 mg  500 mg Oral Q6H Hinda Kehr, MD   500 mg at 11/03/17 1400  . cephALEXin (KEFLEX) capsule 500 mg  500 mg Oral Once Arta Silence, MD      . OLANZapine [facility]) tablet 5 mg  5 mg Oral QHS Arta Silence, MD       Current Outpatient Medications  Medication Sig Dispense Refill  . apixaban (ELIQUIS) 5 MG TABS tablet Take 1 tablet (5 mg total) by mouth 2 (two) times daily. (Patient not taking: Reported on 08/13/2017) 60 tablet 0  . digoxin (LANOXIN) 0.125 MG tablet Take 1 tablet (0.125 mg total) by mouth daily. (Patient not taking: Reported on 08/13/2017) 30 tablet 0  . furosemide (LASIX) 40 MG tablet Take 1 tablet (40 mg total) by mouth 2 (two) times daily. (Patient not taking: Reported on 08/13/2017) 30 tablet 11  . ipratropium-albuterol (DUONEB) 0.5-2.5 (3) MG/3ML SOLN Take 3 mLs by nebulization every 6 (six) hours as needed. (Patient not taking: Reported on 05/14/2017) 360 mL   . magnesium oxide (MAG-OX) 400 (241.3 Mg) MG tablet Take 1 tablet (400 mg total) by mouth daily. (Patient not taking: Reported on 08/13/2017)    . metoprolol tartrate (LOPRESSOR) 25 MG tablet Take 0.5 tablets (12.5 mg total) by mouth 2 (two) times daily. (Patient not taking: Reported on 08/13/2017)    . OLANZapine (ZYPREXA) 5 MG tablet Take 1 tablet (5 mg total) by mouth at bedtime. 30 tablet 1    Musculoskeletal: Strength & Muscle Tone: within normal limits Gait & Station: unsteady Patient leans: N/A  Psychiatric Specialty Exam: Physical Exam  Nursing note and vitals reviewed. Constitutional: He appears well-developed and well-nourished.  HENT:  Head: Normocephalic and atraumatic.  Eyes: Pupils are equal, round, and reactive to light. Conjunctivae  are normal.  Neck: Normal range of motion.  Cardiovascular: Regular  rhythm and normal heart sounds.  Respiratory: Effort normal. No respiratory distress.  GI: Soft.  Musculoskeletal: Normal range of motion.  Neurological: He is alert.  Skin: Skin is warm and dry.  Psychiatric: He has a normal mood and affect. Judgment normal. His speech is delayed. He is slowed. Thought content is not paranoid and not delusional. Cognition and memory are impaired. He expresses no homicidal and no suicidal ideation.    Review of Systems  Constitutional: Negative.   HENT: Negative.   Eyes: Negative.   Respiratory: Negative.   Cardiovascular: Negative.   Gastrointestinal: Negative.   Musculoskeletal: Negative.   Skin: Negative.   Neurological: Negative.   Psychiatric/Behavioral: Positive for hallucinations and memory loss. Negative for depression, substance abuse and suicidal ideas. The patient is nervous/anxious and has insomnia.     Blood pressure (!) 134/97, pulse 82, temperature 97.9 F (36.6 C), temperature source Oral, resp. rate 20, height _0  (1.778 m), weight 190 lb (86.2 kg), SpO2 99 %.Body mass index is 27.26 kg/m.  General Appearance: Disheveled  Eye Contact:  Fair  Speech:  Clear and Coherent  Volume:  Decreased  Mood:  Euthymic  Affect:  Congruent  Thought Process:  Disorganized  Orientation:  Full (Time, Place, and Person)  Thought Content:  Hallucinations: Visual  Suicidal Thoughts:  No  Homicidal Thoughts:  No  Memory:  Immediate;   Fair Recent;   Poor Remote;   Fair  Judgement:  Fair  Insight:  Fair  Psychomotor Activity:  Decreased  Concentration:  Concentration: Fair  Recall:  [employer] of Knowledge:  Fair  Language:  Fair  Akathisia:  No  Handed:  Right  AIMS (if indicated):     Assets:  Desire for Improvement Housing Resilience  ADL's:  Intact  Cognition:  Impaired,  Mild  Sleep:        Treatment Plan Summary: Medication management and Plan 67 year old  man complaining of visual hallucinations.  Does not seem to be delusional certainly does not seem to be depressed.  No evidence of dangerousness.  Patient could be having visual hallucinations from dementia or possibly from medication.  Does not appear to be abusing substances.  Patient does not require inpatient hospitalization.  No sign of dangerousness.  Does not meet commitment criteria.  I asked him how much this was bothering him and he said he did not think that he needed a medicine.  I agreed to give him 5 mg of olanzapine as an outpatient medicine to help with sleep and the hallucinations possibly.  He can follow-up with his primary care doctor.  Case reviewed with ER physician.  Disposition: No evidence of imminent risk to self or others at present.   Patient does not meet criteria for psychiatric inpatient admission. Supportive therapy provided about ongoing stressors. Discussed crisis plan, support from social network, calling 911, coming to the Emergency Department, and calling Suicide Hotline.  Alethia Berthold, MD 11/03/2017 4:09 PM

## 2017-11-03 NOTE — ED Notes (Signed)
Pt given ginger ale.

## 2017-11-03 NOTE — ED Provider Notes (Signed)
-----------------------------------------   5:00 AM on 11/03/2017 -----------------------------------------   Blood pressure (!) 148/100, pulse 95, temperature 98.2 F (36.8 C), temperature source Oral, resp. rate 20, height 1.778 m (5\' 10" ), weight 86.2 kg (190 lb), SpO2 96 %.  The patient had no acute events since last update.  Calm and cooperative at this time.    I followed up in the lab work is still pending.  The patient had an initially elevated troponin at 0.05.  I looked back through the records and he has been evaluated for chest pain in the past and has had higher troponins in the past.  He is not having any chest pain or shortness of breath at this time.  I repeated a troponin and it was essentially unchanged at 0.06.  I will plan for another repeat troponin to make it a full 3 sets of troponins, but I do not believe that this elevation represents an acute or emergent condition such as ACS.  Likewise, his EKG does not show signs of acute ischemia; I compared it to an old EKG from about 2-1/2 months ago, and it is very similar morphology with the only difference being inverted T waves now in lead III.  Again, the patient is having no signs or symptoms of chest discomfort or pain at this time.  He has a urinary tract infection and I have ordered a urine culture.  I started him on Keflex 500 mg by mouth 4 times a day x10 days as per infectious disease recommendations for complicated UTI.  I reviewed the psychiatric evaluation and have ordered medications as per their recommendations (Lexapro 5 mg by mouth daily in the morning, Ativan 1 mg by mouth every 4 hours as needed for anxiety;.  I did not order the Seroquel dose because I did not receive the report back until about 5 AM.  As per telepsych recommendations, I placed him under IVC pending psychiatric placement.     Loleta RoseForbach, Lyndee Herbst, MD 11/03/17 [phone]

## 2017-11-03 NOTE — ED Notes (Signed)
Pt ambulated down hallway with tech and officer. Pt given ginger ale and graham cracker with peanut butter.

## 2017-11-03 NOTE — ED Notes (Signed)
Pt assisted to bathroom with tech beside him and using walker.

## 2017-11-03 NOTE — ED Notes (Signed)
SOC in progress.  

## 2017-11-03 NOTE — ED Provider Notes (Signed)
-----------------------------------------   4:17 PM on 11/03/2017 -----------------------------------------  I once again took over care on this patient from the previous physician Dr. Roxan Hockeyobinson.  The patient has been medically cleared.  His work-up revealed slightly low B12, which was treated, and findings consistent with UTI although he has no evidence of pyelonephritis or sepsis.  The patient was evaluated by Dr. Toni Amendlapacs from psychiatry, and cleared for discharge home.  Dr. Toni Amendlapacs prescribed Zyprexa, and we will prescribe Keflex for his UTI.  When I went to reassess the patient, he asked me "can't you just discharge me tomorrow?"  Patient complains that where he lives his "spooky" and repeatedly suggested that I just discharge him tomorrow, although when I pointed out that nothing would change tomorrow if he was to stay in the hospital, he agreed.  I once again had a thorough discussion with the patient about why he was in the hospital and what his living situation was.  The patient stays in a boarding house, and says he is in fact safe there.  He appears to be independent with his ADLs, traveling around by cab.  He denies any specific reason why he cannot go home - he just states that he does not like it there.  There is no medical indication to keep him the hospital, and there is no indication for psychiatric hospitalization.  I explained this to the patient, and he agrees.  Patient was already evaluated by social work when he was last here in May and there was some discussion of having home health assistant with placement if he needed it, but he was discharged safely to home at that time.  At this time there is no evidence of danger to self or others.  The patient agrees that he can in fact go home.  I discussed the results of his work-up and return precautions, and he expressed understanding of these as well.    Dionne BucySiadecki, Roselee Tayloe, MD 11/03/17 [phone]

## 2017-11-03 NOTE — ED Notes (Signed)
BEHAVIORAL HEALTH ROUNDING Patient sleeping: Yes.   Patient alert and oriented: eyes closed  Appears to be asleep Behavior appropriate: Yes.  ; If no, describe:  Nutrition and fluids offered: Yes  Toileting and hygiene offered: sleeping Sitter present: q 15 minute observations and security monitoring Law enforcement present: yes   

## 2017-11-04 LAB — URINE CULTURE: Special Requests: NORMAL

## 2017-12-06 DEATH — deceased

## 2019-06-09 IMAGING — CR DG CHEST 2V
2 series · 2 of 2 positions shown · non-contrast
Comparison: 08/12/2017

CLINICAL DATA: Hallucinations

EXAM:
CHEST - 2 VIEW

[chest lat]
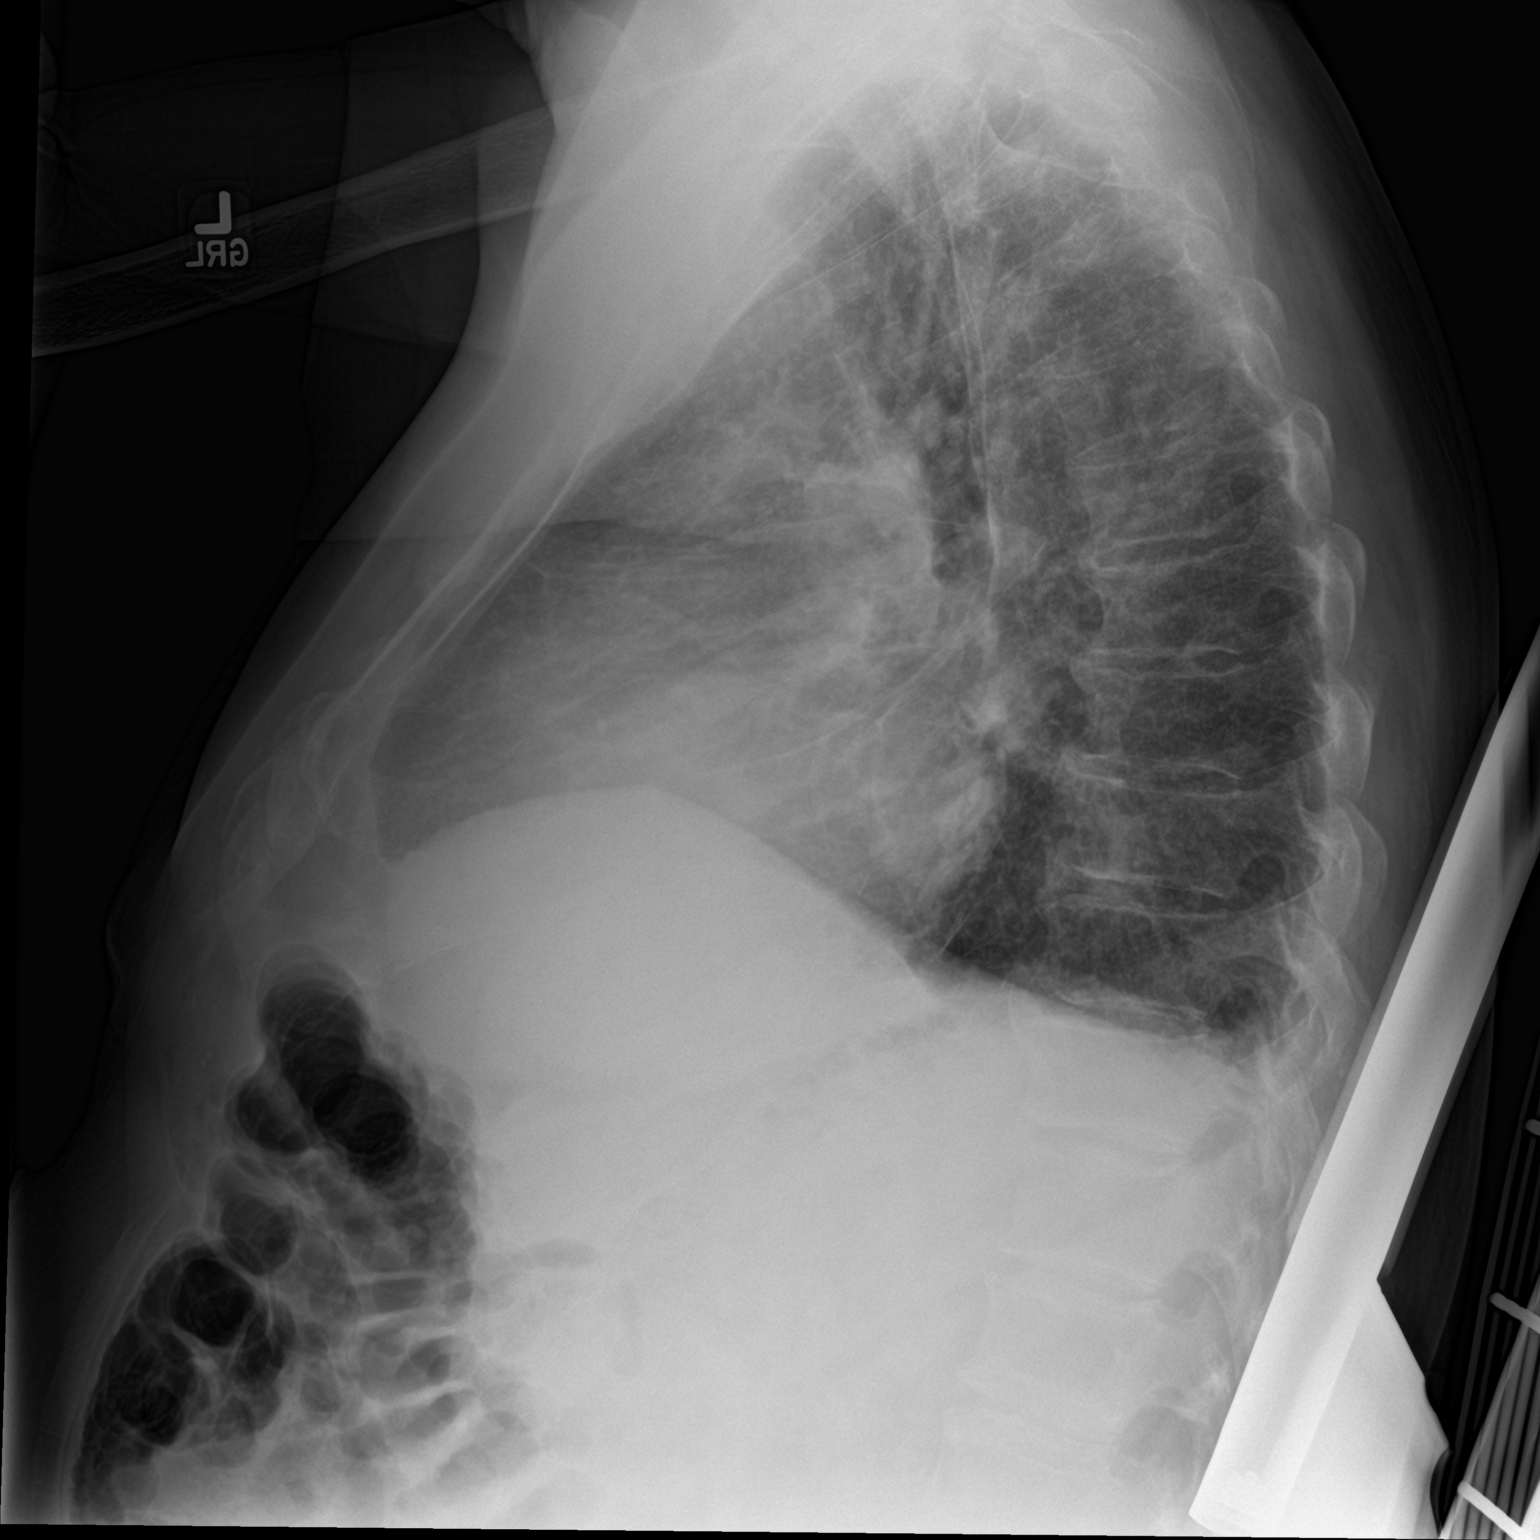

[chest ap]
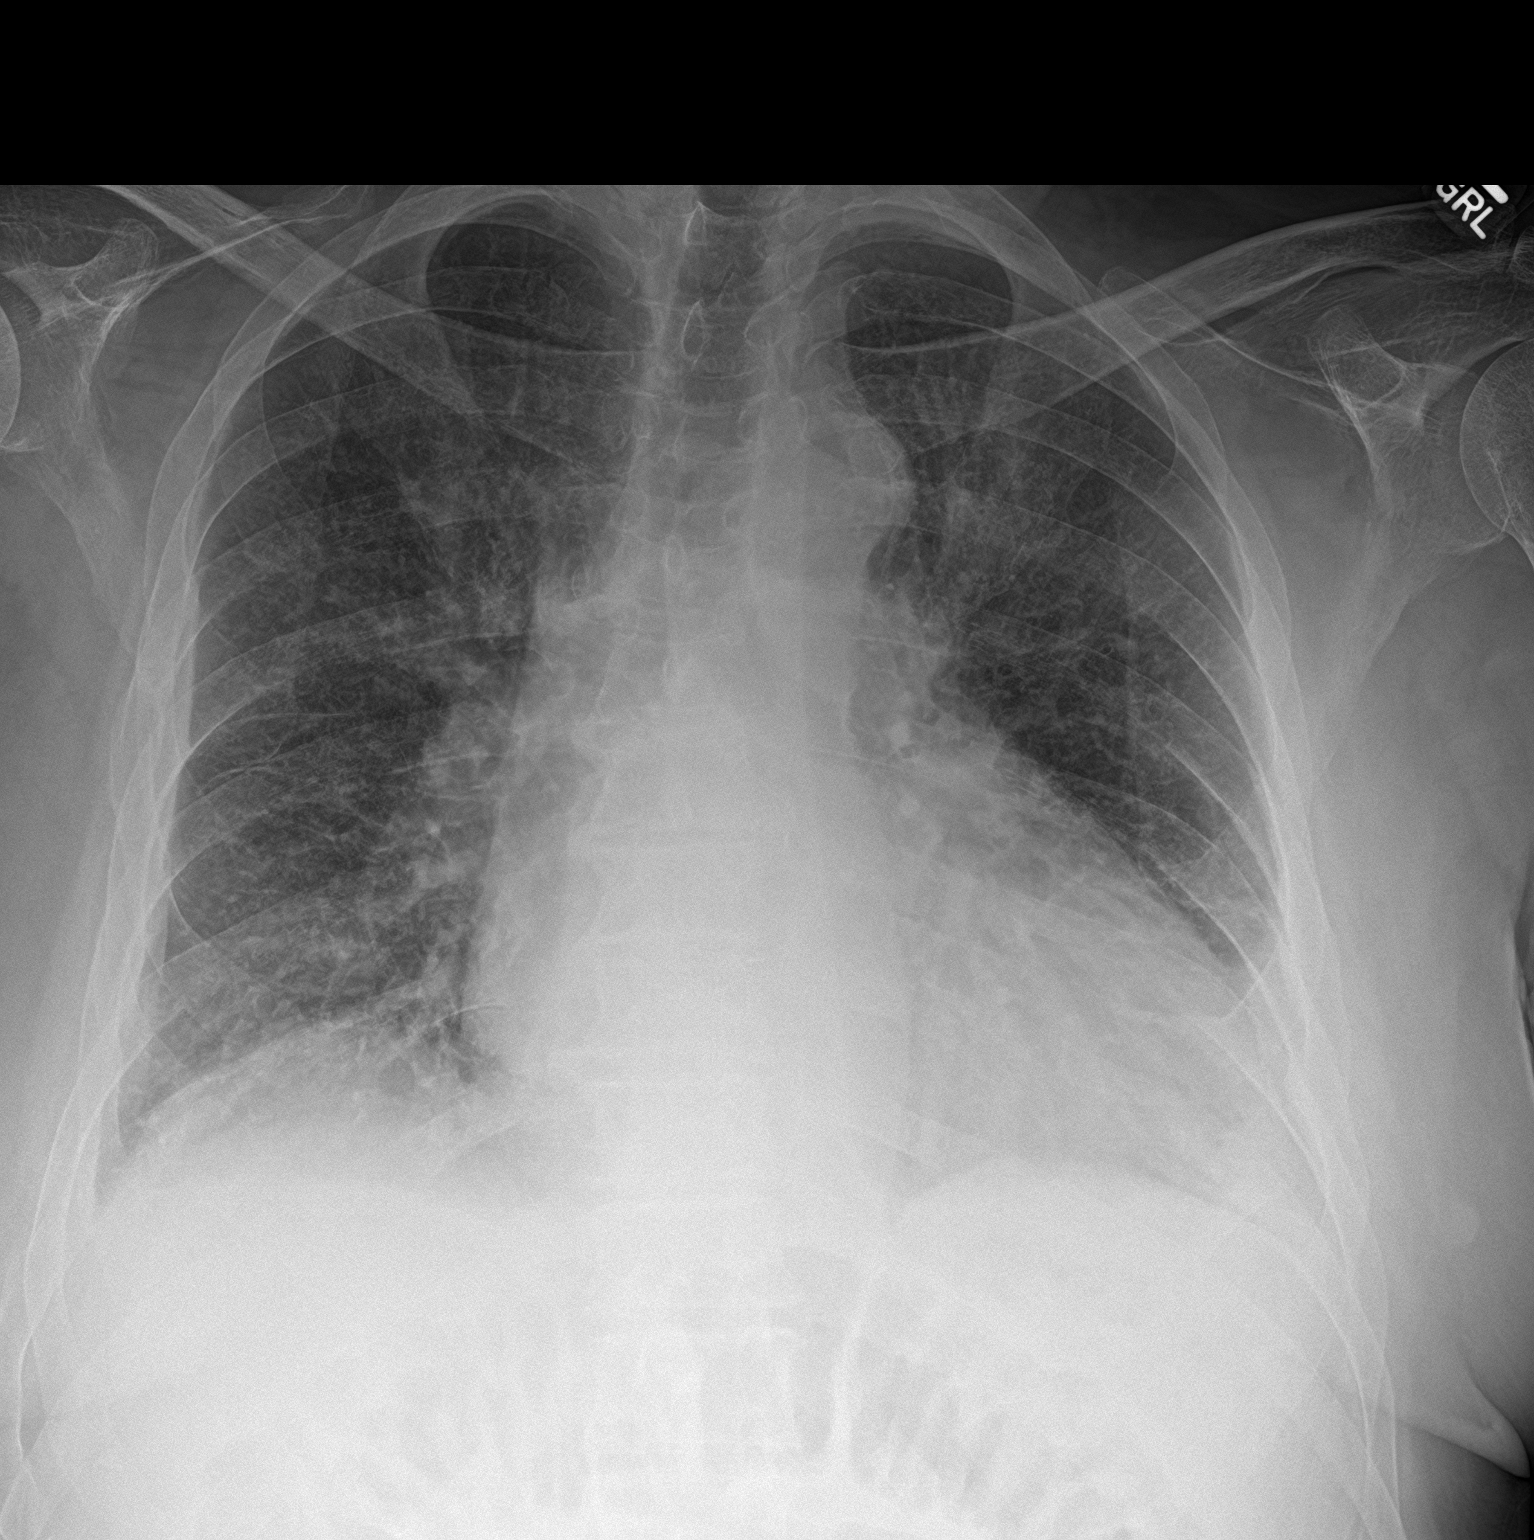

[2 of 2 positions shown; findings below may reference images not displayed]

FINDINGS: Cardiomegaly with vascular congestion and mild diffuse interstitial
opacity suspect for minimal edema. Streaky atelectasis at the bases.
Probable tiny pleural effusions. No pneumothorax.
IMPRESSION: Cardiomegaly with vascular congestion and minimal interstitial
edema. Suspect that there are tiny pleural effusions.

## 2019-06-09 IMAGING — CT CT HEAD W/O CM
3 series · 16 of 47 positions shown, 19 images · non-contrast
Comparison: None.

CLINICAL DATA: Hallucinations

EXAM:
CT HEAD WITHOUT CONTRAST
TECHNIQUE: Contiguous axial images were obtained from the base of the skull
through the vertex without intravenous contrast.

[Series 3: head wo · axial · 0.43mm/px · z∈[+192,+317]mm · 10 of 31 slices shown, 13 images]
[im 3/31  brain]
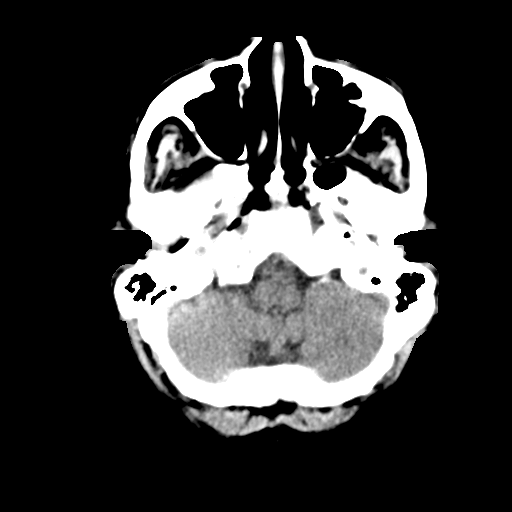
[im 3/31  bone]
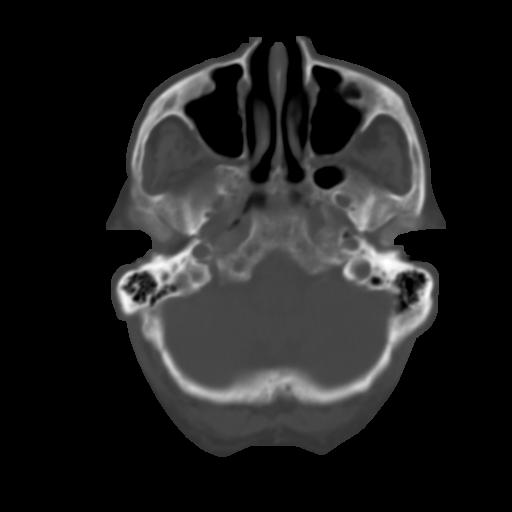
[im 6/31  brain]
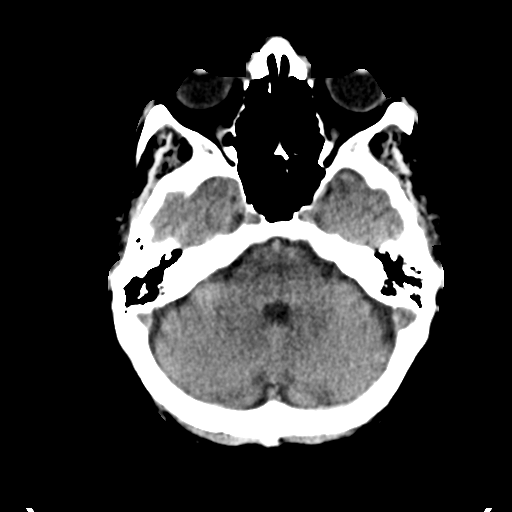
[im 9/31  brain]
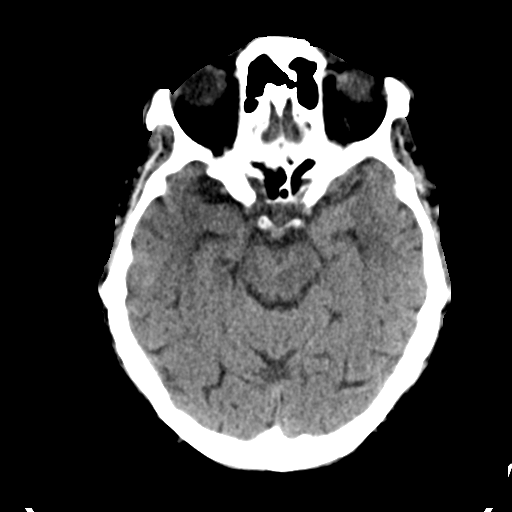
[im 11/31  brain]
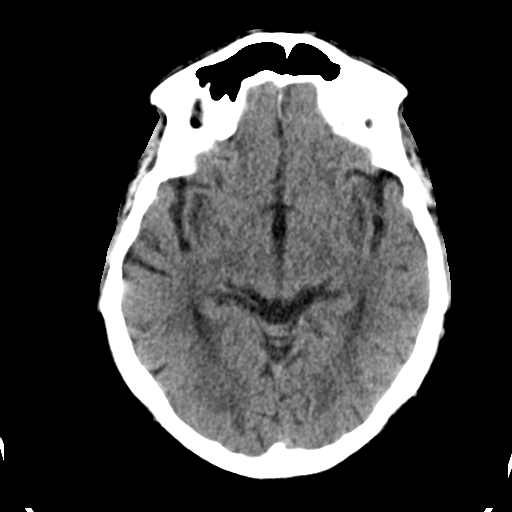
[im 14/31  brain]
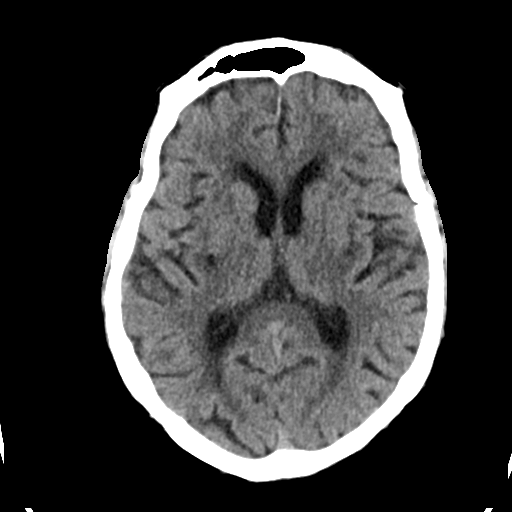
[im 14/31  bone]
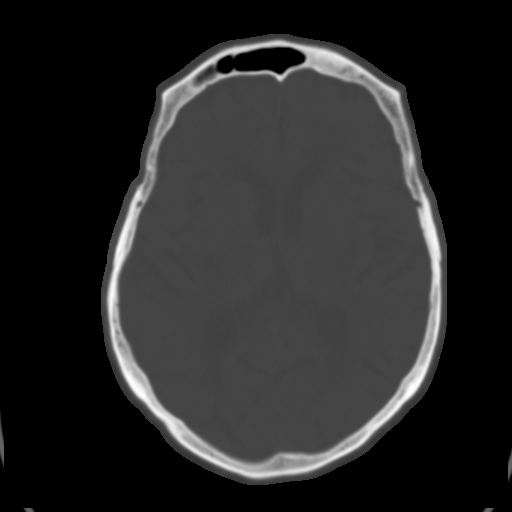
[im 17/31  brain]
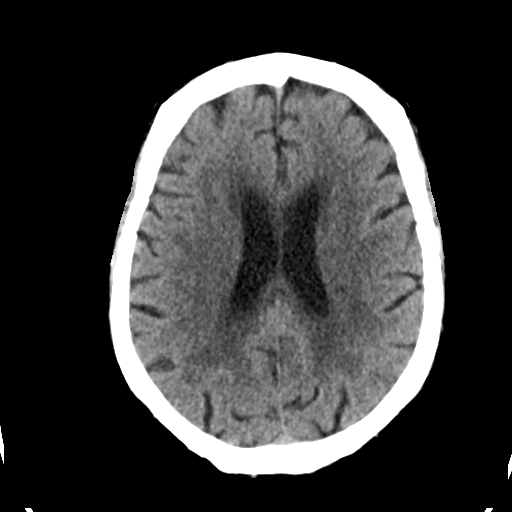
[im 20/31  brain]
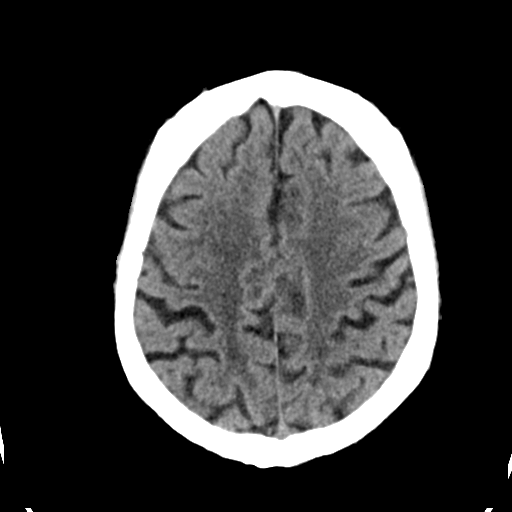
[im 23/31  brain]
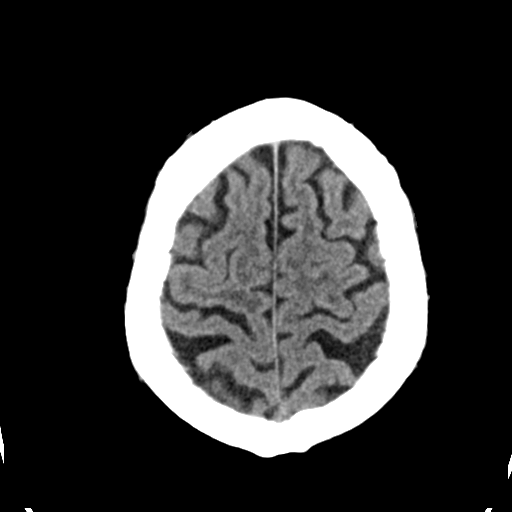
[im 25/31  brain]
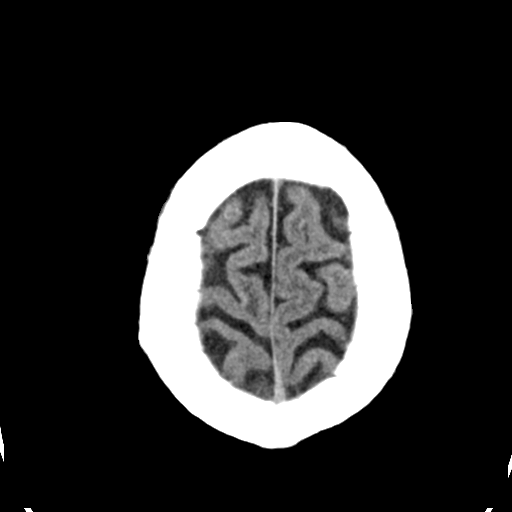
[im 25/31  bone]
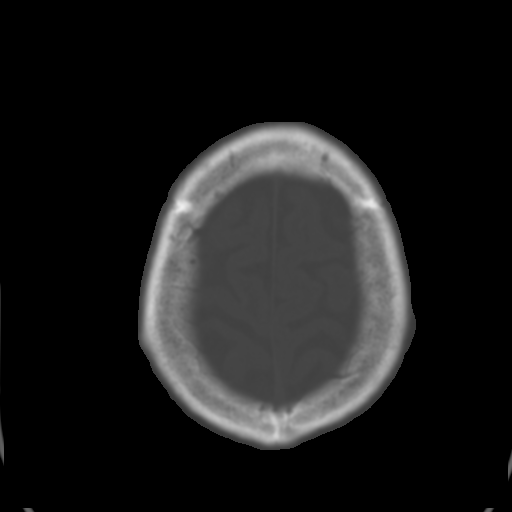
[im 28/31  brain]
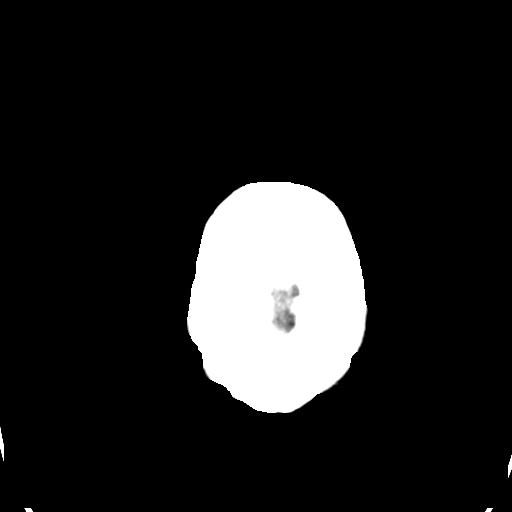

[Series 4: coronal soft tissue · coronal · 0.30mm/px · 3 of 69 slices shown]
[im 23/69  brain]
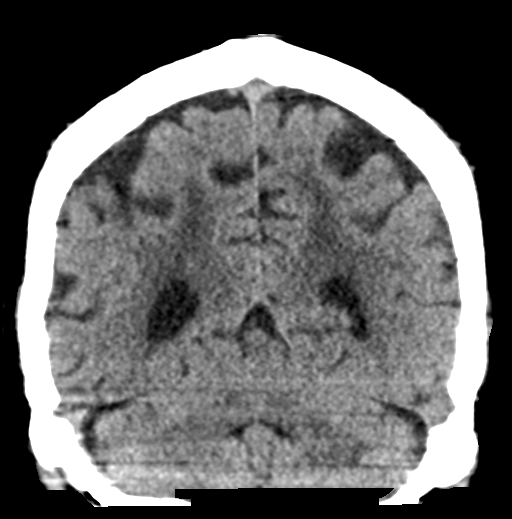
[im 31/69  brain]
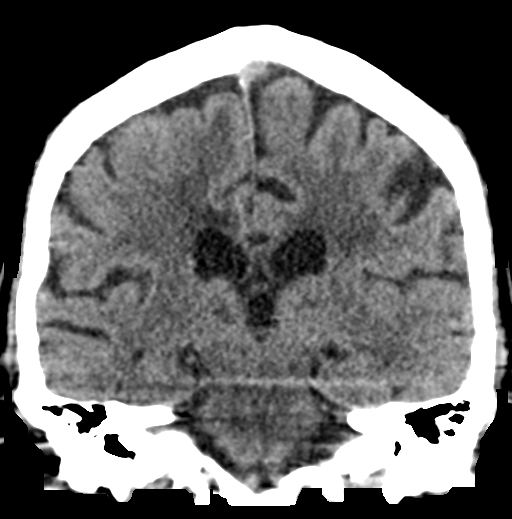
[im 38/69  brain]
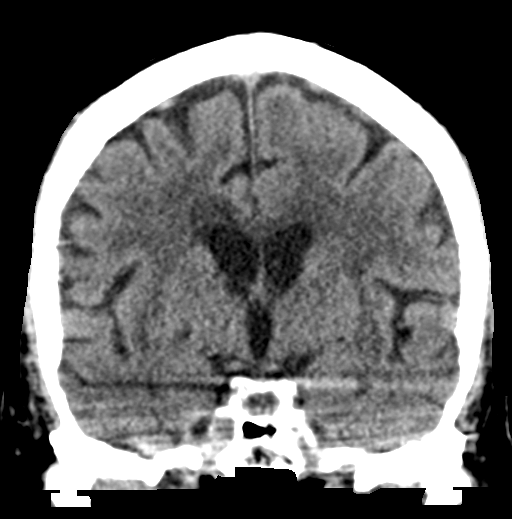

[Series 5: sagittal soft tissue · sagittal · 0.31mm/px · 3 of 53 slices shown]
[im 18/53  brain]
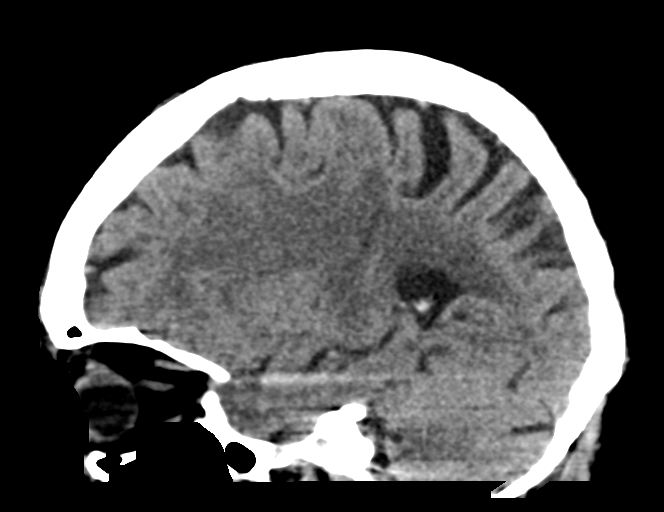
[im 27/53  brain]
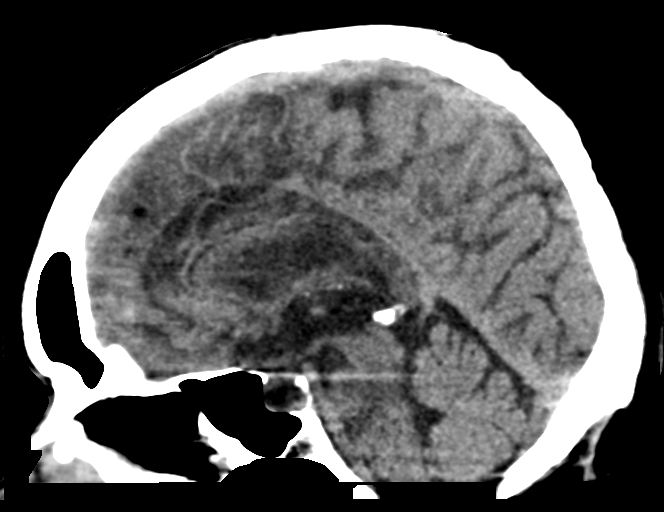
[im 35/53  brain]
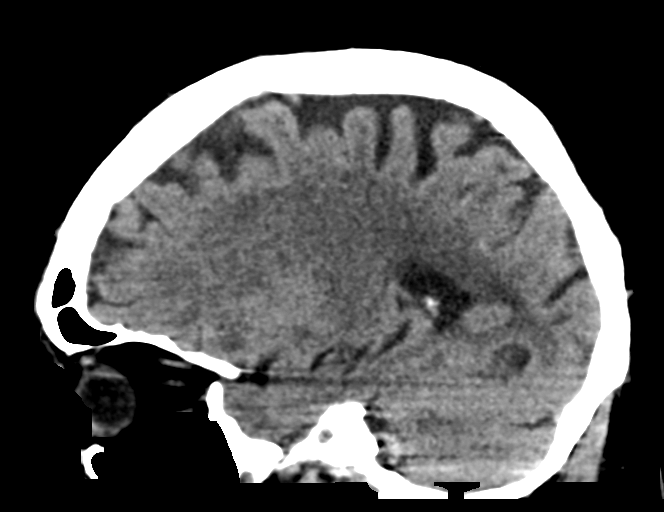

[16 of 47 positions shown; findings below may reference images not displayed]

FINDINGS: Brain: No evidence of acute infarction, hemorrhage, hydrocephalus,
extra-axial collection or mass lesion/mass effect.

Subcortical white matter and periventricular small vessel ischemic
changes.

Vascular: No hyperdense vessel or unexpected calcification.

Skull: Normal. Negative for fracture or focal lesion.

Sinuses/Orbits: The visualized paranasal sinuses are essentially
clear. The mastoid air cells are unopacified.

Other: None.
IMPRESSION: No evidence of acute intracranial abnormality.

Small vessel ischemic changes.
